# Patient Record
Sex: Female | Born: 1985 | Race: White | Hispanic: No | Marital: Single | Smoking: Current every day smoker
Health system: Southern US, Community
[De-identification: ages and names within clinical notes are randomized; demographics above are authoritative.]

## PROBLEM LIST (undated history)

## (undated) ENCOUNTER — Emergency Department (HOSPITAL_BASED_OUTPATIENT_CLINIC_OR_DEPARTMENT_OTHER): Payer: BC Managed Care – PPO

## (undated) ENCOUNTER — Inpatient Hospital Stay (HOSPITAL_COMMUNITY): Payer: PRIVATE HEALTH INSURANCE

## (undated) ENCOUNTER — Inpatient Hospital Stay (HOSPITAL_COMMUNITY): Payer: Self-pay

## (undated) DIAGNOSIS — IMO0002 Reserved for concepts with insufficient information to code with codable children: Secondary | ICD-10-CM

## (undated) DIAGNOSIS — D649 Anemia, unspecified: Secondary | ICD-10-CM

## (undated) DIAGNOSIS — Z8619 Personal history of other infectious and parasitic diseases: Secondary | ICD-10-CM

## (undated) DIAGNOSIS — E611 Iron deficiency: Secondary | ICD-10-CM

## (undated) DIAGNOSIS — N926 Irregular menstruation, unspecified: Secondary | ICD-10-CM

## (undated) DIAGNOSIS — A630 Anogenital (venereal) warts: Secondary | ICD-10-CM

## (undated) HISTORY — DX: Anemia, unspecified: D64.9

## (undated) HISTORY — DX: Reserved for concepts with insufficient information to code with codable children: IMO0002

## (undated) HISTORY — DX: Iron deficiency: E61.1

## (undated) HISTORY — DX: Irregular menstruation, unspecified: N92.6

## (undated) HISTORY — DX: Personal history of other infectious and parasitic diseases: Z86.19

---
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
## 2006-01-12 ENCOUNTER — Inpatient Hospital Stay (HOSPITAL_COMMUNITY): Admission: AD | Admit: 2006-01-12 | Discharge: 2006-01-12 | Payer: Self-pay | Admitting: Obstetrics and Gynecology

## 2006-01-26 ENCOUNTER — Inpatient Hospital Stay (HOSPITAL_COMMUNITY): Admission: AD | Admit: 2006-01-26 | Discharge: 2006-01-26 | Payer: Self-pay | Admitting: Obstetrics and Gynecology

## 2006-01-31 ENCOUNTER — Other Ambulatory Visit: Admission: RE | Admit: 2006-01-31 | Discharge: 2006-01-31 | Payer: Self-pay | Admitting: Obstetrics and Gynecology

## 2006-04-04 ENCOUNTER — Inpatient Hospital Stay (HOSPITAL_COMMUNITY): Admission: AD | Admit: 2006-04-04 | Discharge: 2006-04-04 | Payer: Self-pay | Admitting: Obstetrics and Gynecology

## 2006-04-13 ENCOUNTER — Inpatient Hospital Stay (HOSPITAL_COMMUNITY): Admission: AD | Admit: 2006-04-13 | Discharge: 2006-04-13 | Payer: Self-pay | Admitting: Family Medicine

## 2006-04-13 ENCOUNTER — Encounter: Payer: Self-pay | Admitting: Emergency Medicine

## 2006-06-15 ENCOUNTER — Inpatient Hospital Stay (HOSPITAL_COMMUNITY): Admission: AD | Admit: 2006-06-15 | Discharge: 2006-06-15 | Payer: Self-pay | Admitting: Obstetrics and Gynecology

## 2006-06-16 ENCOUNTER — Inpatient Hospital Stay (HOSPITAL_COMMUNITY): Admission: AD | Admit: 2006-06-16 | Discharge: 2006-06-16 | Payer: Self-pay | Admitting: Obstetrics and Gynecology

## 2006-08-21 ENCOUNTER — Inpatient Hospital Stay (HOSPITAL_COMMUNITY): Admission: AD | Admit: 2006-08-21 | Discharge: 2006-08-23 | Payer: Self-pay | Admitting: Obstetrics and Gynecology

## 2007-06-26 ENCOUNTER — Emergency Department (HOSPITAL_COMMUNITY): Admission: EM | Admit: 2007-06-26 | Discharge: 2007-06-26 | Payer: Self-pay | Admitting: Emergency Medicine

## 2007-08-24 ENCOUNTER — Encounter: Payer: Self-pay | Admitting: Internal Medicine

## 2007-10-02 ENCOUNTER — Emergency Department (HOSPITAL_COMMUNITY): Admission: EM | Admit: 2007-10-02 | Discharge: 2007-10-02 | Payer: Self-pay | Admitting: Emergency Medicine

## 2009-04-09 ENCOUNTER — Emergency Department (HOSPITAL_COMMUNITY): Admission: EM | Admit: 2009-04-09 | Discharge: 2009-04-09 | Payer: Self-pay | Admitting: Emergency Medicine

## 2009-10-21 ENCOUNTER — Emergency Department (HOSPITAL_COMMUNITY): Admission: EM | Admit: 2009-10-21 | Discharge: 2009-10-22 | Payer: Self-pay | Admitting: Emergency Medicine

## 2010-04-09 ENCOUNTER — Encounter: Admission: RE | Admit: 2010-04-09 | Discharge: 2010-04-09 | Payer: Self-pay | Admitting: Internal Medicine

## 2010-05-30 DIAGNOSIS — IMO0002 Reserved for concepts with insufficient information to code with codable children: Secondary | ICD-10-CM

## 2010-05-30 DIAGNOSIS — R87619 Unspecified abnormal cytological findings in specimens from cervix uteri: Secondary | ICD-10-CM

## 2010-05-30 HISTORY — DX: Unspecified abnormal cytological findings in specimens from cervix uteri: R87.619

## 2010-05-30 HISTORY — DX: Reserved for concepts with insufficient information to code with codable children: IMO0002

## 2010-09-01 LAB — URINALYSIS, ROUTINE W REFLEX MICROSCOPIC
Hgb urine dipstick: NEGATIVE
Protein, ur: NEGATIVE mg/dL
Urobilinogen, UA: 1 mg/dL (ref 0.0–1.0)

## 2010-09-01 LAB — COMPREHENSIVE METABOLIC PANEL
ALT: 13 U/L (ref 0–35)
Alkaline Phosphatase: 59 U/L (ref 39–117)
CO2: 21 mEq/L (ref 19–32)
Chloride: 104 mEq/L (ref 96–112)
Glucose, Bld: 109 mg/dL — ABNORMAL HIGH (ref 70–99)
Potassium: 3.7 mEq/L (ref 3.5–5.1)
Sodium: 136 mEq/L (ref 135–145)
Total Bilirubin: 0.7 mg/dL (ref 0.3–1.2)
Total Protein: 7.5 g/dL (ref 6.0–8.3)

## 2010-09-01 LAB — DIFFERENTIAL
Basophils Relative: 0 % (ref 0–1)
Eosinophils Absolute: 0.1 10*3/uL (ref 0.0–0.7)
Monocytes Relative: 4 % (ref 3–12)
Neutrophils Relative %: 74 % (ref 43–77)

## 2010-09-01 LAB — POCT PREGNANCY, URINE: Preg Test, Ur: NEGATIVE

## 2010-09-01 LAB — CBC
Hemoglobin: 16.4 g/dL — ABNORMAL HIGH (ref 12.0–15.0)
RBC: 5.12 MIL/uL — ABNORMAL HIGH (ref 3.87–5.11)
RDW: 12.2 % (ref 11.5–15.5)
WBC: 9.1 10*3/uL (ref 4.0–10.5)

## 2010-10-15 NOTE — Op Note (Signed)
NAMEJAYDYN, Teresa Farley             ACCOUNT NO.:  [id number]   MEDICAL RECORD NO.:  [id number]          PATIENT TYPE:  INP   LOCATION:  9115                          [facility]ILITY:  [facility]   PHYSICIAN:  Hal Morales, M.D.DATE OF BIRTH:  Oct 22, 1985   DATE OF PROCEDURE:  08/21/2006  DATE OF DISCHARGE:                               OPERATIVE REPORT   PREOPERATIVE DIAGNOSIS:  Intrauterine pregnancy at term, premature  rupture of membranes and meconium-stained amniotic fluid.   POSTOPERATIVE DIAGNOSES:  Intrauterine pregnancy at term, premature  rupture of membranes and meconium-stained amniotic fluid, tight nuchal  cord.   OPERATION:  Vacuum-assisted vaginal delivery over intact perineum.   SURGEON:  Dr. Maris Berger Haygood.   ANESTHESIA:  Epidural.   ESTIMATED BLOOD LOSS:  Less than 500 mL.   COMPLICATIONS:  None.   FINDINGS:  The patient was delivered of a female infant whose name is  Alyssa weighing 7 pounds 1 ounce with Apgars of 4 and 9 at one and five  minutes, respectively.  The cord pH was 7.29.  The placenta contained an  eccentrically-inserted three-vessel cord.   PROCEDURE:  The patient was in the lithotomy position for pushing and  had been pushing close to 2 hours.  She had brought the vertex onto the  perineum and had distended the perineum approximately 5 cm.  During the  pushing time, the patient had intermittently had deep variable  decelerations to around 88 beats per minute.  A discussion had been held  with the patient concerning the option of vacuum-assisted vaginal  delivery, its risks and benefits.  Once the patient had brought the  vertex to the aforementioned level, she said that she could not push any  longer, and the fetal heart rate decreased to 70-beat per minute range  and remained in that range.  The kiwi vacuum was then placed over the  vertex, and with a single pull, the fetal head was delivered over the  intact perineum.  The vacuum was removed and  a tight nuchal cord that  could not be reduced was noted.  It was divided between [place] clamps and  the remainder of the infant delivered with a combination of maternal  expulsive efforts and gentle traction..  The infant was placed in the  isolette and suctioned and blow-by instituted until the pediatric team  arrived.  The appropriate cord blood was drawn including a cord pH.  The  placenta was noted to have separated and was removed with a combination  of maternal expulsive efforts and gentle traction.  Adequate hemostasis  was achieved with fundal  massage.  There was a small first-degree vaginal laceration just inside  the introitus which did not require suture.  At this point, the perineum  was cleansed and an ice pack placed, and the patient left with her  infant in the [facility] facility for initial infant bonding.  It is  anticipated that she will go to the full-term nursery.      Hal Morales, M.D.  Electronically Signed     VPH/MEDQ  D:  08/21/2006  T:  08/22/2006  Job:  [id number]

## 2010-10-15 NOTE — Discharge Summary (Signed)
Teresa Farley, Teresa Farley             ACCOUNT NO.:  [id number]   MEDICAL RECORD NO.:  [id number]          PATIENT TYPE:  INP   LOCATION:  9115                          [facility]:  [facility]   PHYSICIAN:  Naima A. Dillard, M.D. DATE OF BIRTH:  [birth year]   DATE OF ADMISSION:  08/21/2006  DATE OF DISCHARGE:  08/23/2006                               DISCHARGE SUMMARY   ADMITTING DIAGNOSIS:  Intrauterine pregnancy at term.  Premature rupture  of membranes.   PROCEDURE:  Vacuum-assisted vaginal delivery.   DISCHARGE DIAGNOSES:  Intrauterine pregnancy at term delivered,  premature rupture of membranes, terminal meconium-stained amniotic  fluid, vacuum-assisted vaginal delivery, tight nuchal cord.  Teresa Farley is a 25 year old gravida 1, para 0 who presents at [redacted] weeks  gestation with premature rupture of membranes.  Labor was augmented with  Pitocin.  The patient progressed to 4 cm, at which time she was noted to  have several episodes of prolonged fetal heart rate decelerations to the  80s with return to the baseline, despite O2 by IV fluid bolus and  maternal position change.  She then began to have persistent late  decelerations.  At that point, Pitocin was turned off and terbutaline  was administered.  Dr. Dierdre Forth was called for evaluation.  It  was thought that the fetal heart rate decelerations were probably due to  hyperstimulation.  Pitocin was left off for approximately 1 hour, and  then started at a low dose.  Fetal heart rate remained reassuring and  that point until complete dilation, and during the pushing stage, the  patient had intermittent deep variable decelerations to the 80s.  The  patient continued pushing well; however, as the patient was crowning,  fetal heart rate decreased to 70 beats per minute and stayed in that  range.  Discussion was entered into by Dr. Dierdre Forth with the  patient and her family, and decision was made for vacuum extraction,  vaginal  assistance.  This was accomplished by Dr. Dierdre Forth.  Please see her dictated note.  The patient gave birth to a 7-pound 1-  ounce female infant named Alyssa with Apgar scores of 4 at 1 minute, 9  at 5 minutes, cord pH 7.29.  Both the patient and infant have been well  in the postpartum period.  Baby is breast feeding well, and the  patient's vital signs have remained stable.  She is afebrile.  Her  hemoglobin on the first postpartum day was 10.6.  Mild thrombocytopenia  was noted.  Her platelets on admission were 128, on the first postpartum  day 113, on the second postpartum day 75.  She is judged to be in  satisfactory condition for discharge.  However, she will come to the  office of [facility] in 1 week to repeat platelet count.   DISCHARGE INSTRUCTIONS:  Are per [facility] handout.   DISCHARGE MEDICATIONS:  1. Motrin 600 mg p.o. q.6 h p.r.n. pain  2. Tylox 1-2 p.o. q.3-4 h p.r.n. pain.  3. Prenatal vitamins.   The patient will call for any problems or concerns and follow up  in 1  week as stated above.      Teresa Farley, C.N.M.      Naima A. Normand Sloop, M.D.  Electronically Signed    SDM/MEDQ  D:  08/23/2006  T:  08/23/2006  Job:  [id number]

## 2010-10-15 NOTE — H&P (Signed)
Teresa Farley, Teresa Farley NO.:  [id number]   MEDICAL RECORD NO.:  [id number]          PATIENT TYPE:  MAT   LOCATION:  [facility]                          [facility]:  [facility]   PHYSICIAN:  Janine Limbo, M.D.DATE OF BIRTH:  [birth year]   DATE OF ADMISSION:  08/21/2006  DATE OF DISCHARGE:                              HISTORY & PHYSICAL   HISTORY OF PRESENT ILLNESS:  Ms. Flicker is a 25 year old, single,  white female, primigravida at 40-0/7 weeks who presents with leakage of  clear fluid since 3:40 a.m. and onset of contractions at that time.  She  denies bleeding, no signs or symptoms of PIH.  Her pregnancy has been  followed by the [facility] midwife service and has been  remarkable for:   1. First trimester bleeding.  2. History of condyloma.  3. Group B Streptococcus negative.   PRENATAL LABORATORY DATA:  Her prenatal labs were collected on 01/31/06.  Hemoglobin 13.8, hematocrit 40.4, platelet count 158,000, blood type B  positive, antibody negative.  RPR nonreactive.  Rubella immune.  Hepatitis B surface antigen negative.  HIV nonreactive.  Pap smear  within normal limits.  Gonorrhea negative. Chlamydia negative. Cystic  fibrosis negative.  Screening from 03/07/06 within normal limits.  One  hour Glucola from 05/19/06 was 95. RPR at that time was nonreactive.  Cultures of the vaginal tract for group B streptococcus, gonorrhea and  Chlamydia from 07/20/06 were all negative.   HISTORY OF PRESENT PREGNANCY:  The patient presented for care at [facility] on 01/31/06 at [redacted] weeks gestation.  Nuchal translucency from [redacted]  weeks gestation was within normal limits.  She declines a full first  trimester screen. Her quad screen was also within normal limits, done at  [redacted] weeks gestation.  Pregnancy ultrasonography at [redacted] weeks gestation for  anatomy showed growth consistent with previous dating, confirming ECD of  08/21/06.  Pregnancy ultrasonography was done at  31-1/[redacted] weeks gestation  for measuring size greater than dates, estimated fetal weight was 1895  grams with normal fluid, vertex presentation. She had some protein in  her urine at 36-1/[redacted] weeks gestation for which she had a negative PIH  workup. The rest of her prenatal care has been unremarkable.   OBSTETRICAL HISTORY:  She is primigravida.   PAST MEDICAL HISTORY:  No medication allergies.  She experienced  menarche at the age of 70 with 28 day cycles lasting 7 days.  She has  used oral contraceptives in the past.  She was diagnosed with condyloma  genital warts in 2006.  She reports having had the usual childhood  illnesses.  The patient is a previous smoker. She quit last year.   PAST SURGICAL HISTORY:  Negative.   FAMILY HISTORY:  Remarkable for the patient's mother smoking.   GENETIC HISTORY:  Negative.   SOCIAL HISTORY:  Father of the baby is not involved.  His name is  Electronics engineer.  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 Father of the baby has a 12th grade education and is  employed full  time in carpet.  They deny any alcohol, tobacco or illicit  drug use with the pregnancy.   OBJECTIVE DATA:  VITAL SIGNS: Blood pressure is 106/55.  Vital signs are  stable.  She is afebrile.  HEENT:  Grossly within normal limits.  HEART: Regular rate and rhythm.  ABDOMEN:  Gravid in contour with sternal head extending approximately 39  cm above pubic symphysis.  Fetal heart rate is in the 120s to 130s,  reassuring, negative CST.  Contractions are every 2-3 minutes.  PELVIC:  Remarkable for clear fluid on perineum, positive Nitrazine,  positive ferning, cervix is 1 cm, 60% effaced, vertex -2.  EXTREMITIES:  Normal.   ASSESSMENT:  1. Intrauterine pregnancy at term.  2. Spontaneous rupture of membranes.  3. Early labor.   PLAN:  1. Admit to birthing suite.  2. Routine C&M orders.  3. Patient plans pain medication as labor progresses.  4. Plan of  expectant management.      Cam Hai, C.N.M.      Janine Limbo, M.D.  Electronically Signed    KS/MEDQ  D:  08/21/2006  T:  08/21/2006  Job:  [id number]

## 2010-10-29 HISTORY — PX: DILATION AND CURETTAGE OF UTERUS: SHX78

## 2010-11-10 ENCOUNTER — Inpatient Hospital Stay (HOSPITAL_COMMUNITY): Payer: Medicaid Other

## 2010-11-10 ENCOUNTER — Ambulatory Visit (HOSPITAL_COMMUNITY)
Admission: AD | Admit: 2010-11-10 | Discharge: 2010-11-10 | Disposition: A | Discharge status: Home / Self Care | Payer: Medicaid Other | Source: Ambulatory Visit | Attending: Family Medicine | Admitting: Family Medicine

## 2010-11-10 ENCOUNTER — Other Ambulatory Visit: Payer: Self-pay | Admitting: Family Medicine

## 2010-11-10 DIAGNOSIS — R58 Hemorrhage, not elsewhere classified: Secondary | ICD-10-CM

## 2010-11-10 DIAGNOSIS — IMO0002 Reserved for concepts with insufficient information to code with codable children: Secondary | ICD-10-CM | POA: Insufficient documentation

## 2010-11-10 DIAGNOSIS — R52 Pain, unspecified: Secondary | ICD-10-CM

## 2010-11-10 DIAGNOSIS — R109 Unspecified abdominal pain: Secondary | ICD-10-CM | POA: Insufficient documentation

## 2010-11-10 DIAGNOSIS — O074 Failed attempted termination of pregnancy without complication: Secondary | ICD-10-CM

## 2010-11-10 LAB — ABO/RH: ABO/RH(D): B POS

## 2010-11-10 LAB — URINALYSIS, ROUTINE W REFLEX MICROSCOPIC
Glucose, UA: NEGATIVE mg/dL
Leukocytes, UA: NEGATIVE
Specific Gravity, Urine: <1.005 — ABNORMAL LOW (ref 1.005–1.030)
pH: 6 (ref 5.0–8.0)

## 2010-11-10 LAB — URINE MICROSCOPIC-ADD ON

## 2010-11-10 LAB — CBC
HCT: 31.2 % — ABNORMAL LOW (ref 36.0–46.0)
MCHC: 34.3 g/dL (ref 30.0–36.0)
MCV: 91 fL (ref 78.0–100.0)
RDW: 12.2 % (ref 11.5–15.5)

## 2010-11-11 NOTE — Op Note (Signed)
  Teresa Farley, Teresa Farley             ACCOUNT NO.:  [id number]  MEDICAL RECORD NO.:  [id number]  LOCATION:  [facility]                          [facility]  [facility]  PHYSICIAN:  Rogan Wigley S. Shawnie Pons, M.D.   DATE OF BIRTH:  1985/11/14  DATE OF PROCEDURE:  11/10/2010 DATE OF DISCHARGE:                              OPERATIVE REPORT   PREOPERATIVE DIAGNOSIS:  Retained products of conception.  POSTOPERATIVE DIAGNOSIS:  Retained products of conception.  PROCEDURE:  Suction dilation and curettage.  SURGEON:  Shelbie Proctor. Shawnie Pons, MD  ASSISTANT:  None.  ANESTHESIA:  General and local.  FINDINGS:  Approximately 10-week size uterus.  Old blood and clot noted in the uterus.  SPECIMENS:  Uterine contents to pathology.  BLOOD LOSS:  Minimal.  COMPLICATIONS:  None known.  REASON FOR PROCEDURE:  Briefly, the patient is a 25 year old who underwent a termination of pregnancy last week came in today with heavy bleeding.  Ultrasound showed what looked to be intrauterine gestational sac with fetal pole with no heart beat versus clot.  They were unclear exactly what it was but given her bleeding and amount of pain, it was felt she would best served by operative procedure.  PROCEDURE:  The patient taken to the OR where she was placed in dorsal lithotomy in Sunrise stirrups.  She was prepped and draped in the usual sterile fashion.  Red rubber catheter was used to drain her bladder.  A speculum was placed inside the vagina.  Cervix was visualized and grasped anteriorly with single-tooth tenaculum.  A 20 mL of 1% lidocaine with epinephrine were injected for paracervical block.  Attempt was to sound the uterus, however, the sound could not be passed.  Ultrasound was then used until the uterus sounded to approximately 10 cm. Sequential dilation was done at curved 10-French curette.  Suction curette was then used and passed until there was minimal amount of tissue and clot removed from the uterus.  Sharp curettage was  then done until there was gritty texture in all surfaces.  The suction curette was passed one more time.  Minimal tissue was removed.  All instruments were then removed from the vagina. Bimanual exam revealed the firm uterus with minimal blood loss.  All instrument and lap counts were correct x2.  The patient was awakened and taken to recovery room in stable condition.     Shelbie Proctor. Shawnie Pons, M.D.     TSP/MEDQ  D:  11/10/2010  T:  11/11/2010  Job:  [id number]  Electronically Signed by Tinnie Gens M.D. on 11/11/2010 03:28:37 PM

## 2010-12-06 ENCOUNTER — Ambulatory Visit (INDEPENDENT_AMBULATORY_CARE_PROVIDER_SITE_OTHER): Payer: Self-pay | Admitting: Obstetrics & Gynecology

## 2010-12-06 ENCOUNTER — Inpatient Hospital Stay: Admission: EM | Admit: 2010-12-06 | Payer: Self-pay | Source: Ambulatory Visit | Admitting: Obstetrics & Gynecology

## 2010-12-06 ENCOUNTER — Encounter: Payer: Self-pay | Admitting: Obstetrics & Gynecology

## 2010-12-06 VITALS — BP 115/66 | HR 76 | Temp 97.8°F | Ht 67.0 in | Wt 164.0 lb

## 2010-12-06 DIAGNOSIS — Z09 Encounter for follow-up examination after completed treatment for conditions other than malignant neoplasm: Secondary | ICD-10-CM

## 2010-12-06 NOTE — Progress Notes (Signed)
  Subjective:     Teresa Farley is a 25 y.o. female who presents to the clinic 4 weeks status post dilation and curettage for first trimester miscarriage. Eating a regular diet without difficulty. Bowel movements are normal. The patient is not having any pain.  The following portions of the patient's history were reviewed and updated as appropriate: allergies, current medications, past medical history, past social history, past surgical history and problem list.  Review of Systems Genitourinary:negative for dysuria    Objective:    BP 115/66  Pulse 76  Temp(Src) 97.8 F (36.6 C) (Oral)  Ht 5\' 7"  (1.702 m)  Wt 164 lb (74.39 kg)  BMI 25.69 kg/m2  LMP 08/30/2010 General:  alert  Abdomen: deferred  Incision:   no incision     Assessment:    Doing well postoperatively. Operative findings again reviewed. Pathology report discussed.    Plan:    1. Continue any current medications. 2. Wound care discussed. 3. Activity restrictions: Abstain from intercourse until IUD 4. Anticipated return to work: now. 5. Follow up: 1 week for Mirena placement.

## 2010-12-06 NOTE — Patient Instructions (Signed)
IMPORTANT: HOW TO USE THIS INFORMATION:  This is a summary and does NOT have all possible information about this product. This information does not assure that this product is safe, effective, or appropriate for you. This information is not individual medical advice and does not substitute for the advice of your health care professional. Always ask your health care professional for complete information about this product and your specific health needs.    LEVONORGESTREL-RELEASING IMPLANT - INTRAUTERINE (lee-voh-nor-JEST-rell)    COMMON BRAND NAME(S): Mirena    USES:  This product is a small, flexible device that is placed in the womb (uterus) to prevent pregnancy. It is used in women who desire reversible birth control that works for a long time (up to 5 years). The device works by slowly releasing a hormone (levonorgestrel) that is similar to a certain substance made by a woman's body. This product is only intended for women who have previously given birth and have only one sexual partner. It is not meant for women with a history of certain infections/conditions (e.g., pelvic inflammatory disease, sexually transmitted disease, a certain problem pregnancy called ectopic pregnancy). For more information, consult your doctor. The use of this medication device does not protect you or your partner against sexually transmitted diseases (e.g., HIV, gonorrhea). Carefully read all of the information provided by your doctor, and ask any questions you may have about this product or other birth control methods that may be right for you.    HOW TO USE:  Read the Patient Information Leaflet provided by your pharmacist before this medication device is inserted and each time it is re-inserted. The leaflet contains very important information about side effects and when it is important to call your doctor. If you have any questions, consult your doctor or pharmacist. This product is inserted into your uterus by a properly  trained health care professional, usually once every 5 years or as determined by your doctor. The medication in the device is slowly released into the body over a 5-year period. Have a follow-up appointment 4-12 weeks after insertion of this product to check that it is still correctly in place. If you still desire birth control after 5 years, the medication device may be replaced with a new one. The medication device may also be removed at any time by a properly trained health care professional. Learn all the instructions on how and when to check this product and its proper positioning in your body, and make sure you understand the problems that may occur with this product. See also Precautions section.    SIDE EFFECTS:  Irregular vaginal bleeding (e.g., spotting), cramps, headache, nausea, breast pain, acne, rash, hair loss, weight gain, or decreased interest in sex may occur. If any of these effects persist or worsen, tell your doctor promptly. Remember that your doctor has prescribed this medication device because he or she has judged that the benefit to you is greater than the risk of side effects. Many people using this medication device do not have serious side effects. Tell your doctor immediately if any of these serious side effects occur: lack of menstrual period, unexplained fever, chills, trouble breathing, mental/mood changes (e.g., depression, nervousness), vaginal swelling/itching, painful intercourse. Tell your doctor immediately if any of these unlikely but serious side effects occur: migraine/severe headache, vomiting, tiredness, fast/pounding heartbeat. Tell your doctor immediately if any of these highly unlikely but very serious side effects occur: prolonged or heavy vaginal bleeding, unusual vaginal discharge/odor, vaginal sores, abdominal/pelvic pain or   tenderness, lumps in the breast, yellowing eyes/skin, dark urine, persistent nausea, trouble urinating. A very serious allergic reaction to  this drug is rare. However, seek immediate medical attention if you notice any of the following symptoms of a serious allergic reaction: rash, itching/swelling (especially of the face/tongue/throat), severe dizziness, trouble breathing. This is not a complete list of possible side effects. If you notice other effects not listed above, contact your doctor or pharmacist. In the [place] - Call your doctor for medical advice about side effects. You may report side effects to FDA at 1-800-FDA-1088. In [place] - Call your doctor for medical advice about side effects. You may report side effects to [employer] [place] at 1-866-234-2345.    PRECAUTIONS:  Before using this medication device, tell your doctor or pharmacist if you are allergic to levonorgestrel, or to any other progestins (e.g., norethindrone, desogestrel); or if you have any other allergies. This product may contain inactive ingredients, which can cause allergic reactions or other problems. Talk to your pharmacist for more details. This medication device should not be used if you have certain medical conditions. Before using this product, consult your doctor or pharmacist if you have: current known or suspected pregnancy, previous ectopic pregnancy, uterus problems (e.g., cancer, endometriosis, fibroids, pelvic inflammatory disease-PID), other IUD (intrauterine device) still in place, vaginal problems (e.g., infection), breast cancer, liver disease/tumors, any condition that affects your immune system (e.g., AIDS, leukemia). Before using this product, tell your doctor your medical history, especially of: bleeding problems (e.g., menstrual changes, clotting problems), heart problems (e.g., congenital valve conditions), high blood pressure, migraine headaches, stroke, diabetes. If you have diabetes, this medication may make it harder to control your blood sugar levels. Monitor your blood sugar regularly as directed by your doctor. Tell your doctor the results and any  symptoms such as increased thirst/urination. Your anti-diabetic medication or diet may need to be adjusted. This medication device may sometimes come out by itself or move out of place. This may result in unwanted pregnancy or other problems. After each menstrual period, check to make sure it is in the right place. Talk to your doctor about how to check your device. If it comes out or you cannot feel its threads, call your doctor promptly, and use a backup birth control method such as condoms. If you or partner has any other sexual partners, this medication device may no longer be a good choice for pregnancy prevention. If you or your partner becomes HIV positive, or if you think you may have been exposed to any sexually transmitted disease, contact your doctor immediately. You should consider having this device removed. This medication device must not be used during pregnancy. If you become pregnant or think you may be pregnant, tell your doctor immediately. If you have just given birth and are not breast-feeding, or if you have had a pregnancy loss or abortion after the 3 months of pregnancy, wait at least 6 weeks (or as directed by your doctor) before using this medication device. Consult your doctor about the problems that may occur during pregnancy while using this product. Levonorgestrel passes into breast milk. Consult your doctor before breast-feeding.    DRUG INTERACTIONS:  Your doctor or pharmacist may already be aware of any possible drug interactions and may be monitoring you for them. Do not start, stop, or change the dosage of any medicine before checking with them first. Before using this medication device, tell your doctor of all prescription and nonprescription medications you may use, especially   of: "blood thinners" (e.g., warfarin), birth control taken by mouth or applied to the skin (patch), certain drug used for varicose vein treatment (sodium tetradecyl sulfate), drugs that affect your immune  response (e.g., corticosteroids such as prednisone). This document does not contain all possible interactions. Therefore, before using this product, tell your doctor or pharmacist of all the products you use. Keep a list of all your medications with you, and share the list with your doctor and pharmacist.    OVERDOSE:  Overdose with this medication is very unlikely because of the way the drug is released from this device. Consult your doctor or pharmacist for more information.    NOTES:  Do not share this medication with others. Keep all appointments with your doctor and the laboratory. You should have regular complete physical exams including blood pressure, breast exam, pelvic exam, and screening for cervical cancer (Pap smear). Follow your doctor's instructions for examining your own breasts, and report any lumps immediately. Consult your doctor for more details.    MISSED DOSE:  Not applicable.    STORAGE:  Before use, store at room temperature at 77 degrees F (25 degrees C) away from light and moisture. Brief storage between 59-86 degrees F (15-30 degrees C) is permitted. Keep all medications and medical devices away from children and pets. Do not flush medications down the toilet or pour them into a drain unless instructed to do so. Properly discard this product when it is expired or no longer needed. Consult your pharmacist or local waste disposal company for more details about how to safely discard your product.    Information last revised June 2010 Copyright(c) 2010 [employer]      

## 2011-01-13 ENCOUNTER — Ambulatory Visit: Payer: Self-pay | Admitting: Obstetrics & Gynecology

## 2011-02-17 LAB — URINALYSIS, ROUTINE W REFLEX MICROSCOPIC
Glucose, UA: NEGATIVE
Hgb urine dipstick: NEGATIVE
Ketones, ur: 15 — AB
Protein, ur: NEGATIVE

## 2011-02-23 ENCOUNTER — Encounter: Payer: Self-pay | Admitting: Obstetrics & Gynecology

## 2011-02-23 ENCOUNTER — Telehealth: Payer: Self-pay | Admitting: Obstetrics and Gynecology

## 2011-02-23 ENCOUNTER — Ambulatory Visit (INDEPENDENT_AMBULATORY_CARE_PROVIDER_SITE_OTHER): Payer: Medicaid Other | Admitting: Obstetrics & Gynecology

## 2011-02-23 VITALS — BP 121/75 | HR 84 | Temp 98.2°F | Resp 20 | Ht 67.0 in | Wt 165.7 lb

## 2011-02-23 DIAGNOSIS — A630 Anogenital (venereal) warts: Secondary | ICD-10-CM

## 2011-02-23 DIAGNOSIS — Z30430 Encounter for insertion of intrauterine contraceptive device: Secondary | ICD-10-CM

## 2011-02-23 DIAGNOSIS — N898 Other specified noninflammatory disorders of vagina: Secondary | ICD-10-CM

## 2011-02-23 DIAGNOSIS — Z01812 Encounter for preprocedural laboratory examination: Secondary | ICD-10-CM

## 2011-02-23 DIAGNOSIS — Z975 Presence of (intrauterine) contraceptive device: Secondary | ICD-10-CM

## 2011-02-23 HISTORY — DX: Anogenital (venereal) warts: A63.0

## 2011-02-23 NOTE — Telephone Encounter (Signed)
GC/Chlamydia was ordered to be tested using the urine she left for UA pregnancy (used for endometrial biopsy). The urine was poured out. Called patient to tell her to get the test done at [facility] department since she goes there for her Condyloma treatment (per patient). If not, then she can come back here at the clinic for these tests (GC/chlamydia). Left message in patient's VM to call us back in regard of this.

## 2011-02-23 NOTE — Progress Notes (Signed)
  Subjective:    Patient ID: Teresa Farley, female    DOB: [birth year], 25 y.o.   MRN: [id number]  HPI    Review of Systems     Objective:   Physical Exam        Assessment & Plan:  IUD Insertion Procedure Note  Pre-operative Diagnosis: desires LARC  Post-operative Diagnosis: same  Indications: contraception  Procedure Details  Urine pregnancy test was done  and result was negative.  The risks (including infection, bleeding, pain, and uterine perforation) and benefits of the procedure were explained to the patient and Verbal informed consent was obtained.    Cervix cleansed with Betadine. Uterus sounded to 8 cm. IUD inserted without difficulty. String visible and trimmed. Patient tolerated procedure well.  IUD Information: Mirena.  Condition: Stable  Complications: None  Plan: Annual exam in January  The patient was advised to call for any fever or for prolonged or severe pain or bleeding. She was advised to use NSAID as needed for mild to moderate pain.   Attending Physician Documentation: I was present for or participated in the entire procedure, including opening and closing.

## 2011-02-23 NOTE — Progress Notes (Signed)
Addended by: Toula Moos on: 02/23/2011 03:27 PM   Modules accepted: Orders

## 2011-02-25 NOTE — Telephone Encounter (Signed)
Called pt- left message that we need to speak to her regarding a test which has been ordered. Please call back

## 2011-02-28 NOTE — Telephone Encounter (Signed)
Pt left message on callback line that she is returning our call. [phone]

## 2011-02-28 NOTE — Telephone Encounter (Signed)
Spoke with patient. She will come by on Weds 03/02/11 to give urine for gc/chlamydia.

## 2011-03-02 ENCOUNTER — Other Ambulatory Visit: Payer: Medicaid Other

## 2011-03-03 ENCOUNTER — Other Ambulatory Visit: Payer: Medicaid Other

## 2011-08-27 ENCOUNTER — Emergency Department (HOSPITAL_COMMUNITY)
Admission: EM | Admit: 2011-08-27 | Discharge: 2011-08-27 | Disposition: A | Discharge status: Home / Self Care | Payer: BC Managed Care – PPO | Source: Home / Self Care | Attending: Family Medicine | Admitting: Family Medicine

## 2011-08-27 ENCOUNTER — Encounter (HOSPITAL_COMMUNITY): Payer: Self-pay

## 2011-08-27 DIAGNOSIS — A084 Viral intestinal infection, unspecified: Secondary | ICD-10-CM

## 2011-08-27 DIAGNOSIS — A09 Infectious gastroenteritis and colitis, unspecified: Secondary | ICD-10-CM

## 2011-08-27 MED ORDER — DEXTROSE-NACL 5-0.45 % IV SOLN
Freq: Once | INTRAVENOUS | Status: AC
  Administered 2011-08-27: 15:00:00 via INTRAVENOUS

## 2011-08-27 MED ORDER — ONDANSETRON 4 MG PO TBDP
ORAL_TABLET | ORAL | Status: AC
  Filled 2011-08-27: qty 1

## 2011-08-27 MED ORDER — ONDANSETRON 4 MG PO TBDP
4.0000 mg | ORAL_TABLET | Freq: Once | ORAL | Status: AC
  Administered 2011-08-27: 4 mg via ORAL

## 2011-08-27 MED ORDER — ONDANSETRON HCL 4 MG PO TABS: 4.0000 mg | ORAL_TABLET | Freq: Four times a day (QID) | ORAL | Status: AC

## 2011-08-27 NOTE — ED Notes (Signed)
Pt has nausea, vomiting and diarrhea since 6 am today, daughter recently had norovirus.

## 2011-08-27 NOTE — Discharge Instructions (Signed)
Clear liquid , bland diet tonight as tolerated, advance on sunday as improved, use medicine as needed for vomiting, use imodium for diarrhea as needed., return or see your doctor if any problems.

## 2011-08-27 NOTE — ED Provider Notes (Signed)
History     CSN: [id number]  Arrival date & time 08/27/11  1252   First MD Initiated Contact with Patient 08/27/11 1255      Chief Complaint  Patient presents with  . Emesis  . Diarrhea    (Consider location/radiation/quality/duration/timing/severity/associated sxs/prior treatment) Patient is a 26 y.o. female presenting with vomiting and diarrhea. The history is provided by the patient and a parent.  Emesis  This is a new problem. The current episode started 6 to 12 hours ago. The problem occurs 2 to 4 times per day. The problem has not changed since onset.The emesis has an appearance of stomach contents. There has been no fever. Associated symptoms include diarrhea. Pertinent negatives include no fever. Risk factors include ill contacts (daughter recently with same.).  Diarrhea The primary symptoms include nausea, vomiting and diarrhea. Primary symptoms do not include fever or rash.    History reviewed. No pertinent past medical history.  Past Surgical History  Procedure Date  . Dilation and curettage of uterus june 2012    Family History  Problem Relation Age of Onset  . Cancer Maternal Grandmother     breast cancer    History  Substance Use Topics  . Smoking status: Current Everyday Smoker -- 0.5 packs/day for 5 years    Types: Cigarettes  . Smokeless tobacco: Never Used  . Alcohol Use: 0.0 oz/week    0 drink(s) per week    OB History    Grav Para Term Preterm Abortions TAB SAB Ect Mult Living   3 1 1  0 2 1 1  0 0 1      Review of Systems  Constitutional: Positive for activity change and appetite change. Negative for fever.  HENT: Negative.   Respiratory: Negative.   Cardiovascular: Negative.   Gastrointestinal: Positive for nausea, vomiting and diarrhea. Negative for blood in stool.  Genitourinary: Negative.   Skin: Negative for rash.    Allergies  Review of patient's allergies indicates no known allergies.  Home Medications   Current Outpatient Rx   Name Route Sig Dispense Refill  . ONDANSETRON HCL 4 MG PO TABS Oral Take 1 tablet (4 mg total) by mouth every 6 (six) hours. 8 tablet 0    BP 129/69  Pulse 90  Temp(Src) 98.3 F (36.8 C) (Oral)  Resp 16  SpO2 100%  LMP 07/29/2011  Physical Exam  Nursing note and vitals reviewed. Constitutional: She is oriented to person, place, and time. She appears well-developed and well-nourished. She appears distressed.  HENT:  Head: Normocephalic.  Mouth/Throat: Oropharynx is clear and moist.  Eyes: Pupils are equal, round, and reactive to light.  Neck: Normal range of motion. Neck supple.  Cardiovascular: Normal rate and regular rhythm.   Abdominal: Soft. Bowel sounds are normal. She exhibits no distension and no mass. There is no tenderness. There is no rebound and no guarding.  Lymphadenopathy:    She has no cervical adenopathy.  Neurological: She is alert and oriented to person, place, and time.  Skin: Skin is warm and dry.    ED Course  Procedures (including critical care time)  Labs Reviewed - No data to display No results found.   1. Gastroenteritis and colitis, viral       MDM  Sx improved after ivf and med.      Linna Hoff, MD 08/27/11 1538

## 2011-11-15 ENCOUNTER — Emergency Department (HOSPITAL_COMMUNITY)
Admission: EM | Admit: 2011-11-15 | Discharge: 2011-11-15 | Disposition: A | Discharge status: Home / Self Care | Payer: BC Managed Care – PPO | Source: Home / Self Care | Attending: Emergency Medicine | Admitting: Emergency Medicine

## 2011-11-15 ENCOUNTER — Encounter (HOSPITAL_COMMUNITY): Payer: Self-pay | Admitting: *Deleted

## 2011-11-15 DIAGNOSIS — N739 Female pelvic inflammatory disease, unspecified: Secondary | ICD-10-CM

## 2011-11-15 LAB — POCT URINALYSIS DIP (DEVICE)
Bilirubin Urine: NEGATIVE
Glucose, UA: NEGATIVE mg/dL
Hgb urine dipstick: NEGATIVE
Specific Gravity, Urine: 1.015 (ref 1.005–1.030)

## 2011-11-15 LAB — DIFFERENTIAL
Eosinophils Relative: 1 % (ref 0–5)
Lymphocytes Relative: 32 % (ref 12–46)
Lymphs Abs: 2.7 10*3/uL (ref 0.7–4.0)
Monocytes Absolute: 0.5 10*3/uL (ref 0.1–1.0)

## 2011-11-15 LAB — WET PREP, GENITAL: Trich, Wet Prep: NONE SEEN

## 2011-11-15 LAB — CBC
HCT: 42.4 % (ref 36.0–46.0)
MCV: 91.4 fL (ref 78.0–100.0)
RBC: 4.64 MIL/uL (ref 3.87–5.11)
WBC: 8.3 10*3/uL (ref 4.0–10.5)

## 2011-11-15 MED ORDER — CEFTRIAXONE SODIUM 250 MG IJ SOLR
250.0000 mg | Freq: Once | INTRAMUSCULAR | Status: AC
  Administered 2011-11-15: 250 mg via INTRAMUSCULAR

## 2011-11-15 MED ORDER — TRAMADOL HCL 50 MG PO TABS: 100.0000 mg | ORAL_TABLET | Freq: Three times a day (TID) | ORAL | Status: AC | PRN

## 2011-11-15 MED ORDER — LIDOCAINE HCL (PF) 1 % IJ SOLN
INTRAMUSCULAR | Status: AC
  Filled 2011-11-15: qty 5

## 2011-11-15 MED ORDER — AZITHROMYCIN 250 MG PO TABS
1000.0000 mg | ORAL_TABLET | Freq: Once | ORAL | Status: AC
  Administered 2011-11-15: 1000 mg via ORAL

## 2011-11-15 MED ORDER — CEFTRIAXONE SODIUM 250 MG IJ SOLR
INTRAMUSCULAR | Status: AC
  Filled 2011-11-15: qty 250

## 2011-11-15 MED ORDER — METRONIDAZOLE 500 MG PO TABS: 500.0000 mg | ORAL_TABLET | Freq: Three times a day (TID) | ORAL | Status: AC

## 2011-11-15 MED ORDER — AZITHROMYCIN 250 MG PO TABS
ORAL_TABLET | ORAL | Status: AC
  Filled 2011-11-15: qty 4

## 2011-11-15 NOTE — ED Provider Notes (Signed)
Chief Complaint  Patient presents with  . Abdominal Pain    History of Present Illness:   The patient is a 26 y.o. female who presents today with a history since 1 AM this morning of a sudden onset of bilateral lower abdominal pain. She was awakened out of a sleep with this. The pain is a constant pain punctuated by sudden sharp stabbing. It's worse if she walks and better she sits. She denies any fevers, chills, or sweats. She's felt somewhat nauseated and hasn't had much of an appetite. She denies any vomiting, constipation, diarrhea, or blood in the stool. Her last menstrual period was around June 1. She is sexually active. She has a Mirena IUD. She denies any discharge or itching. Denies urinary symptoms. No prior history of ovarian cysts, pelvic infections, or other GYN complaints.  Review of Systems:  Other than noted above, the patient denies any of the following symptoms: Constitutional:  No fever, chills, fatigue, weight loss or anorexia. Lungs:  No cough or shortness of breath. Heart:  No chest pain, palpitations, syncope or edema.  No cardiac history. Abdomen:  No nausea, vomiting, hematememesis, melena, diarrhea, or hematochezia. GU:  No dysuria, frequency, urgency, or hematuria. Gyn:  No vaginal discharge, itching, abnormal bleeding, dyspareunia, or pelvic pain. Skin:  No rash or itching.  PMFSH:  Past medical history, family history, social history, meds, and allergies were reviewed along with nurse's notes.  No prior abdominal surgeries, past history of GI problems, STDs or GYN problems.  No history of aspirin or NSAID use.  No excessive alcohol intake.  Physical Exam:   Vital signs:  BP 110/69  Pulse 72  Temp 98.5 F (36.9 C) (Oral)  Resp 14  SpO2 100% Gen:  Alert, oriented, in no distress. Lungs:  Breath sounds clear and equal bilaterally.  No wheezes, rales or rhonchi. Heart:  Regular rhythm.  No gallops or murmers.   Abdomen:  Abdomen is soft, flat, nondistended. Bowel  sounds were present. She has moderate bilateral lower abdominal tenderness to palpation without guarding or rebound. No organomegaly or mass. Pelvic:  Normal external genitalia, vaginal and cervical mucosa are normal, no discharge, IUD string is protruding from the cervical os she has moderate cervical motion tenderness, uterus is mid position, normal in size and shape, and moderately tender. She has moderate bilateral adnexal tenderness to palpation in without any mass. Skin:  Clear, warm and dry.  No rash.  Labs:   Results for orders placed during the hospital encounter of 11/15/11  POCT URINALYSIS DIP (DEVICE)      Component Value Range   Glucose, UA NEGATIVE  NEGATIVE mg/dL   Bilirubin Urine NEGATIVE  NEGATIVE   Ketones, ur NEGATIVE  NEGATIVE mg/dL   Specific Gravity, Urine 1.015  1.005 - 1.030   Hgb urine dipstick NEGATIVE  NEGATIVE   pH 6.5  5.0 - 8.0   Protein, ur NEGATIVE  NEGATIVE mg/dL   Urobilinogen, UA 0.2  0.0 - 1.0 mg/dL   Nitrite NEGATIVE  NEGATIVE   Leukocytes, UA NEGATIVE  NEGATIVE  POCT PREGNANCY, URINE      Component Value Range   Preg Test, Ur NEGATIVE  NEGATIVE  CBC      Component Value Range   WBC 8.3  4.0 - 10.5 K/uL   RBC 4.64  3.87 - 5.11 MIL/uL   Hemoglobin 14.6  12.0 - 15.0 g/dL   HCT 16.1  09.6 - 04.5 %   MCV 91.4  78.0 - 100.0  fL   MCH 31.5  26.0 - 34.0 pg   MCHC 34.4  30.0 - 36.0 g/dL   RDW 16.1  09.6 - 04.5 %   Platelets 165  150 - 400 K/uL  DIFFERENTIAL      Component Value Range   Neutrophils Relative 61  43 - 77 %   Neutro Abs 5.0  1.7 - 7.7 K/uL   Lymphocytes Relative 32  12 - 46 %   Lymphs Abs 2.7  0.7 - 4.0 K/uL   Monocytes Relative 6  3 - 12 %   Monocytes Absolute 0.5  0.1 - 1.0 K/uL   Eosinophils Relative 1  0 - 5 %   Eosinophils Absolute 0.1  0.0 - 0.7 K/uL   Basophils Relative 0  0 - 1 %   Basophils Absolute 0.0  0.0 - 0.1 K/uL  WET PREP, GENITAL      Component Value Range   Yeast Wet Prep HPF POC NONE SEEN  NONE SEEN   Trich,  Wet Prep NONE SEEN  NONE SEEN   Clue Cells Wet Prep HPF POC MODERATE (*) NONE SEEN   WBC, Wet Prep HPF POC FEW (*) NONE SEEN    Other Labs Obtained at Urgent Care Center:  GC and Chlamydia DNA probes were obtained.  Results are pending at this time and we will call about any positive results.  Course in Urgent Care Center:   She was given Rocephin 250 mg IM and azithromycin 1000 mg by mouth and tolerated these well without any immediate side effects.  Assessment:  The encounter diagnosis was Pelvic inflammatory disease. I think this could be a PID or a ruptured ovarian cyst. She is at a little bit high risk a PID since she has an IUD in place. I think she needs to be evaluated by a gynecologist. For right now on but only in her IUD in place, but I did tell her that if she got worse, to come back here or go right to the emergency room.  Plan:   1.  The following meds were prescribed:   New Prescriptions   METRONIDAZOLE (FLAGYL) 500 MG TABLET    Take 1 tablet (500 mg total) by mouth 3 (three) times daily.   TRAMADOL (ULTRAM) 50 MG TABLET    Take 2 tablets (100 mg total) by mouth every 8 (eight) hours as needed for pain.   2.  The patient was instructed in symptomatic care and handouts were given. 3.  The patient was told to return if becoming worse in any way, if no better in 3 or 4 days, and given some red flag symptoms that would indicate earlier return.  Follow up:  The patient was told to follow up here in 48 hours for recheck, she was given a note for work in the meantime, and given a referral to see Dr. Rana Snare in one week.     Reuben Likes, MD 11/15/11 [phone]

## 2011-11-15 NOTE — ED Notes (Signed)
Pt waiting on med time.  

## 2011-11-15 NOTE — Discharge Instructions (Signed)
Rest for next 2 days, follow up with gynecologist in 1 week.  Return if you are worse or if not getting better in 2 to 3 days.

## 2011-11-15 NOTE — ED Notes (Signed)
Wet prep and gc swab collected after escorting dr. Lorenz Coaster for pelvic. Labs ordered to be drawn.

## 2011-11-15 NOTE — ED Notes (Signed)
Pt reports being awakened from sleep around 1 am with sharp cramps in lower abdomen and back with little relief from motrin. Denies urinary frequency, pain with urination

## 2011-11-16 LAB — GC/CHLAMYDIA PROBE AMP, GENITAL: GC Probe Amp, Genital: NEGATIVE

## 2011-11-16 NOTE — ED Notes (Signed)
GC/Chlamydia neg., Wet prep: Mod. clue cells, few WBC's.  Pt. adequately treated with Flagyl. Vassie Moselle 11/16/2011

## 2011-11-17 ENCOUNTER — Telehealth (HOSPITAL_COMMUNITY): Payer: Self-pay | Admitting: *Deleted

## 2011-11-17 NOTE — ED Notes (Signed)
Pt. called and said Dr. Lorenz Coaster wanted her to be rechecked in 1 week by Dr. Rana Snare, but she could not be seen until next month. She has an Chief Financial Officer. I told her she could see her own doctor. We only give her the on call doctor if she does not have one to f/u with.  She said she tried [address] and was scheduled for Wed. 7/10 as the earliest appointment.  I asked if she had insurance. She said she is under her mothers insurance. I told her I would try and call for an earlier appointment. I called [phone] and told the scheduler that our doctor wanted her rechecked in 1 week.  She said she would have her supervisor call me back. She said they are on EPIC, so I did not have to fax the chart. Vassie Moselle 11/17/2011

## 2011-11-18 ENCOUNTER — Telehealth (HOSPITAL_COMMUNITY): Payer: Self-pay | Admitting: *Deleted

## 2011-11-18 NOTE — ED Notes (Signed)
[id number] Call on VM from Mel at [facility]. 1234 I called back and left a message to call.  She called me back and said they offered the patient Mon. 6/24 at 0800 and pt. declined.  She said she had to work and said she needed Tues. They could not do that due to physicians being out on vacation.  I asked if she still has the appt. 7/10 and she said yes.  I told her I do not understand because, she could get a work note. I thanked her for her help. Vassie Moselle 11/18/2011

## 2011-12-07 ENCOUNTER — Encounter: Payer: Self-pay | Admitting: Obstetrics and Gynecology

## 2011-12-26 ENCOUNTER — Ambulatory Visit (INDEPENDENT_AMBULATORY_CARE_PROVIDER_SITE_OTHER): Payer: BC Managed Care – PPO | Admitting: Obstetrics and Gynecology

## 2011-12-26 ENCOUNTER — Encounter: Payer: Self-pay | Admitting: Obstetrics and Gynecology

## 2011-12-26 VITALS — BP 122/64 | Ht 67.0 in | Wt 174.0 lb

## 2011-12-26 DIAGNOSIS — R102 Pelvic and perineal pain: Secondary | ICD-10-CM

## 2011-12-26 DIAGNOSIS — N949 Unspecified condition associated with female genital organs and menstrual cycle: Secondary | ICD-10-CM

## 2011-12-26 NOTE — Progress Notes (Signed)
C/o pelvic pain since June 8-9/10 qod  Filed Vitals:   12/26/11 1433  BP: 122/64   ROS: noncontributory  Pelvic exam:  VULVA: normal appearing vulva with no masses, tenderness or lesions,  VAGINA: normal appearing vagina with normal color and discharge, no lesions, CERVIX: normal appearing cervix without discharge or lesions,  UTERUS: uterus is normal size, shape, consistency and nontender,  ADNEXA: normal adnexa in size, nontender and no masses.  A/P Trial of Motrin Sched u/s next available Pt likes IUD otherwise and hopes to keep it (placed 01/2011 after an SAB) GC/CT today with consent

## 2011-12-27 LAB — GC/CHLAMYDIA PROBE AMP, GENITAL
Chlamydia, DNA Probe: NEGATIVE
GC Probe Amp, Genital: NEGATIVE

## 2012-01-02 ENCOUNTER — Telehealth: Payer: Self-pay

## 2012-01-02 NOTE — Telephone Encounter (Signed)
LM for pt to call back re: Appt time for U/S to eval pain w/ IUD. I offered appts for 01/09/2012 @ 9:00 for u/s and 9:45 for Follow up w/ EP. Dr. Su Hilt is not available that day. I will book these appts to save them. Melody Comas A

## 2012-01-09 ENCOUNTER — Ambulatory Visit (INDEPENDENT_AMBULATORY_CARE_PROVIDER_SITE_OTHER): Payer: BC Managed Care – PPO

## 2012-01-09 ENCOUNTER — Ambulatory Visit (INDEPENDENT_AMBULATORY_CARE_PROVIDER_SITE_OTHER): Payer: BC Managed Care – PPO | Admitting: Obstetrics and Gynecology

## 2012-01-09 ENCOUNTER — Encounter: Payer: Self-pay | Admitting: Obstetrics and Gynecology

## 2012-01-09 ENCOUNTER — Other Ambulatory Visit: Payer: Self-pay | Admitting: Obstetrics and Gynecology

## 2012-01-09 VITALS — BP 100/68 | Temp 98.6°F | Wt 174.0 lb

## 2012-01-09 DIAGNOSIS — N949 Unspecified condition associated with female genital organs and menstrual cycle: Secondary | ICD-10-CM

## 2012-01-09 DIAGNOSIS — R102 Pelvic and perineal pain: Secondary | ICD-10-CM

## 2012-01-09 NOTE — Progress Notes (Addendum)
25 YO seen at end of July for pelvic pain presents today for ultrasound.  Patient has a Mirena IUD placed in 01/2011 after SAB.  Patient has been having sharp pain in lower middle abdomen-fleeting without any provocation.  Has positional dyspareunia but no urinary tract symptoms, changes in bowel movements, or heavy lifting/exercises that could have caused pulled muscles in pelvic.  Has not had previous abdominal surgery.  O: Ultrasound: wnl with IUD in proper position per 3 D imaging   A:  Pelvic Pain  P: Reviewed causes of pelvic pain: urogenital, previous surgery, gastrointestinal and musculoskeletal.      Follow up with PCP for non-gyn causes of pelvic pain      RTO-as scheduled or prn.  Ivana Nicastro, PA-C

## 2012-02-21 ENCOUNTER — Emergency Department (HOSPITAL_COMMUNITY)
Admission: EM | Admit: 2012-02-21 | Discharge: 2012-02-22 | Disposition: A | Discharge status: Home / Self Care | Payer: Self-pay | Attending: Emergency Medicine | Admitting: Emergency Medicine

## 2012-02-21 ENCOUNTER — Encounter (HOSPITAL_COMMUNITY): Payer: Self-pay | Admitting: Emergency Medicine

## 2012-02-21 DIAGNOSIS — R29898 Other symptoms and signs involving the musculoskeletal system: Secondary | ICD-10-CM | POA: Insufficient documentation

## 2012-02-21 DIAGNOSIS — R202 Paresthesia of skin: Secondary | ICD-10-CM

## 2012-02-21 DIAGNOSIS — R200 Anesthesia of skin: Secondary | ICD-10-CM

## 2012-02-21 DIAGNOSIS — R209 Unspecified disturbances of skin sensation: Secondary | ICD-10-CM | POA: Insufficient documentation

## 2012-02-21 LAB — CBC WITH DIFFERENTIAL/PLATELET
Basophils Absolute: 0 10*3/uL (ref 0.0–0.1)
Basophils Relative: 1 % (ref 0–1)
Eosinophils Absolute: 0.1 10*3/uL (ref 0.0–0.7)
Eosinophils Relative: 2 % (ref 0–5)
Lymphocytes Relative: 39 % (ref 12–46)
MCHC: 34.7 g/dL (ref 30.0–36.0)
MCV: 90 fL (ref 78.0–100.0)
Platelets: 165 10*3/uL (ref 150–400)
RDW: 12.2 % (ref 11.5–15.5)
WBC: 7.8 10*3/uL (ref 4.0–10.5)

## 2012-02-21 LAB — BASIC METABOLIC PANEL
BUN: 9 mg/dL (ref 6–23)
CO2: 23 mEq/L (ref 19–32)
Calcium: 9.7 mg/dL (ref 8.4–10.5)
Creatinine, Ser: 0.66 mg/dL (ref 0.50–1.10)

## 2012-02-21 NOTE — ED Notes (Signed)
Pt c/o right sided numbness x 3 weeks in leg and arm; pt sts some tingling; pt denies other complaint

## 2012-02-21 NOTE — ED Provider Notes (Signed)
History     CSN: [id number]  Arrival date & time 02/21/12  1814   First MD Initiated Contact with Patient 02/21/12 2343      Chief Complaint  Patient presents with  . Numbness    (Consider location/radiation/quality/duration/timing/severity/associated sxs/prior treatment) HPI Comments: This is a 26 year old female with no PMH, who presents to the ED with a 3 week history of numbness and tingling in her right lower extremities.  She was recently told by the health department that she might have had a mini stroke, and was instructed to follow-up here.  The patient states that the numbness and tingling is worse at night when she lays down. She states that the symptoms radiate to her lower right leg and foot and sometimes effect her right hand. She denies any trauma or mechanism of injury.  She denies any bowel or bladder dysfunction.  Denies any pain in the back or neck. She states that this is the first time that she has experienced these types of symptoms. Patient denies any history of visual disturbance.  The history is provided by the patient. No language interpreter was used.    Past Medical History  Diagnosis Date  . Anemia   . H/O varicella   . Migraine   . Irregular periods/menstrual cycles   . Abnormal Pap smear 2012  . Low iron     Past Surgical History  Procedure Date  . Dilation and curettage of uterus june 2012    Family History  Problem Relation Age of Onset  . Breast cancer Maternal Grandmother   . Cancer Maternal Grandmother     OVARIAN    History  Substance Use Topics  . Smoking status: Current Every Day Smoker -- 0.5 packs/day for 5 years    Types: Cigarettes  . Smokeless tobacco: Never Used  . Alcohol Use: 0.0 oz/week    0 drink(s) per week    OB History    Grav Para Term Preterm Abortions TAB SAB Ect Mult Living   3 1 1  0 2 1 1  0 0 1      Review of Systems  HENT: Negative for neck pain and neck stiffness.   Respiratory: Negative for  shortness of breath.   Cardiovascular: Negative for chest pain.  Neurological: Positive for weakness and numbness. Negative for dizziness and speech difficulty.       Right lower extremity numbness and tingling with associated weakness  All other systems reviewed and are negative.    Allergies  Review of patient's allergies indicates no known allergies.  Home Medications   Current Outpatient Rx  Name Route Sig Dispense Refill  . SULFAMETHOXAZOLE-TMP DS 800-160 MG PO TABS Oral Take 1 tablet by mouth every other day.      BP 114/66  Pulse 89  Temp 98.5 F (36.9 C) (Oral)  Resp 18  SpO2 95%  Physical Exam  Nursing note and vitals reviewed. Constitutional: She is oriented to person, place, and time. She appears well-developed and well-nourished.  HENT:  Head: Normocephalic and atraumatic.  Eyes: Conjunctivae normal and EOM are normal. Pupils are equal, round, and reactive to light.  Neck: Normal range of motion. Neck supple.  Cardiovascular: Normal rate, regular rhythm and normal heart sounds.   Pulmonary/Chest: Effort normal and breath sounds normal.  Abdominal: Soft. Bowel sounds are normal.  Musculoskeletal:       Right lower extremity ROM 5/5 Strength 4/5 Sensation: abnormal 2 point discrimination, position sense, sharp/dull Normal gait  Neurological: She is alert and oriented to person, place, and time.       Reflexes  R patella: 1+ L patella: 2+ Normal CN 3-12  Skin: Skin is warm and dry.  Psychiatric: She has a normal mood and affect. Her behavior is normal. Judgment and thought content normal.    ED Course  Procedures (including critical care time)   Labs Reviewed  BASIC METABOLIC PANEL  CBC WITH DIFFERENTIAL  URINALYSIS, ROUTINE W REFLEX MICROSCOPIC    Results for orders placed during the hospital encounter of 02/21/12  BASIC METABOLIC PANEL      Component Value Range   Sodium 140  135 - 145 mEq/L   Potassium 4.1  3.5 - 5.1 mEq/L   Chloride 105   96 - 112 mEq/L   CO2 23  19 - 32 mEq/L   Glucose, Bld 96  70 - 99 mg/dL   BUN 9  6 - 23 mg/dL   Creatinine, Ser 1.61  0.50 - 1.10 mg/dL   Calcium 9.7  8.4 - 09.6 mg/dL   GFR calc non Af Amer >90  >90 mL/min   GFR calc Af Amer >90  >90 mL/min  CBC WITH DIFFERENTIAL      Component Value Range   WBC 7.8  4.0 - 10.5 K/uL   RBC 4.61  3.87 - 5.11 MIL/uL   Hemoglobin 14.4  12.0 - 15.0 g/dL   HCT 04.5  40.9 - 81.1 %   MCV 90.0  78.0 - 100.0 fL   MCH 31.2  26.0 - 34.0 pg   MCHC 34.7  30.0 - 36.0 g/dL   RDW 91.4  78.2 - 95.6 %   Platelets 165  150 - 400 K/uL   Neutrophils Relative 53  43 - 77 %   Neutro Abs 4.2  1.7 - 7.7 K/uL   Lymphocytes Relative 39  12 - 46 %   Lymphs Abs 3.0  0.7 - 4.0 K/uL   Monocytes Relative 6  3 - 12 %   Monocytes Absolute 0.5  0.1 - 1.0 K/uL   Eosinophils Relative 2  0 - 5 %   Eosinophils Absolute 0.1  0.0 - 0.7 K/uL   Basophils Relative 1  0 - 1 %   Basophils Absolute 0.0  0.0 - 0.1 K/uL  URINALYSIS, ROUTINE W REFLEX MICROSCOPIC      Component Value Range   Color, Urine YELLOW  YELLOW   APPearance CLEAR  CLEAR   Specific Gravity, Urine 1.022  1.005 - 1.030   pH 6.0  5.0 - 8.0   Glucose, UA NEGATIVE  NEGATIVE mg/dL   Hgb urine dipstick NEGATIVE  NEGATIVE   Bilirubin Urine NEGATIVE  NEGATIVE   Ketones, ur 15 (*) NEGATIVE mg/dL   Protein, ur NEGATIVE  NEGATIVE mg/dL   Urobilinogen, UA 1.0  0.0 - 1.0 mg/dL   Nitrite NEGATIVE  NEGATIVE   Leukocytes, UA NEGATIVE  NEGATIVE  POCT PREGNANCY, URINE      Component Value Range   Preg Test, Ur NEGATIVE  NEGATIVE   Ct Head Wo Contrast  02/22/2012  *RADIOLOGY REPORT*  Clinical Data: 26 year old female right side numbness in the extremities.  CT HEAD WITHOUT CONTRAST  Technique:  Contiguous axial images were obtained from the base of the skull through the vertex without contrast.  Comparison: None.  Findings: Small frontal recess sinus ostium is incidentally noted. Visualized paranasal sinuses and mastoids are clear.   No acute osseous abnormality identified.  Visualized orbit soft  tissues are within normal limits.  Visualized scalp soft tissues are within normal limits.  Cerebral volume is within normal limits for age.  No midline shift, ventriculomegaly, mass effect, evidence of mass lesion, intracranial hemorrhage or evidence of cortically based acute infarction.  Gray-white matter differentiation is within normal limits throughout the brain.  No suspicious intracranial vascular hyperdensity.  IMPRESSION: Normal noncontrast CT appearance of the brain.   Original Report Authenticated By: Ulla Potash III, M.D.        1. Numbness and tingling of right arm and leg   2. Weakness of right leg       MDM  This is a 26 year old female who presents with a 3 week history of numbness and weakness in her right lower extremity.  This is her first complaint of this nature.  CT negative for acute process. Will discharge to home and have patient followup with neurology for further evaluation of numbness and weakness. This patient has been discussed with Dr. Hyacinth Meeker who agrees with the treatment plan.        Roxy Horseman, PA-C 02/22/12 0147

## 2012-02-22 ENCOUNTER — Encounter (HOSPITAL_COMMUNITY): Payer: Self-pay | Admitting: Radiology

## 2012-02-22 ENCOUNTER — Emergency Department (HOSPITAL_COMMUNITY): Payer: Self-pay

## 2012-02-22 LAB — URINALYSIS, ROUTINE W REFLEX MICROSCOPIC
Bilirubin Urine: NEGATIVE
Ketones, ur: 15 mg/dL — AB
Leukocytes, UA: NEGATIVE
Nitrite: NEGATIVE
Protein, ur: NEGATIVE mg/dL
Urobilinogen, UA: 1 mg/dL (ref 0.0–1.0)

## 2012-02-22 NOTE — ED Notes (Signed)
Patient transported to CT 

## 2012-02-22 NOTE — ED Provider Notes (Signed)
7 her old female who presents with a complaint of right lower extremity numbness and weakness and tingling. She states this started approximately 3 weeks ago, has been overall very persistent but does wax and wane throughout the day. She denies any problems with the left side of her body but does admit to having intermittently right upper extremity tingling and numbness. This seems to get worse when she uses the keyboard but is a stocking glove distribution and not dermatomal. Her right lower extremity is persistently weak and numb, not swollen, not red, not inflamed. She has no pain in this area. Nothing seems to make this better or worse though maybe it's a slightly improved when she walks. She denies any history of family history of multiple sclerosis or other demyelinating diseases. She denies visual changes, changes in speech.  PE:  Cardiac exam reveals no murmurs, no tachycardia, regular rhythm. Strong peripheral artery pulses at the radial arteries, soft abdomen, clear lungs without any abnormalities. Cranial nerves III through XII intact, bilateral grips are normal, sensation of the right upper extremity and left upper extremities normal to light touch and pinprick. Lower extremities have asymmetrical sensation with left lower extremity normal sensation to light touch pinprick position sense and temperature sensation. The right lower extremity has decreased sensation to light touch pinprick, inability to differentiate temperature and positional such at the toes. Reflexes are decreased bilaterally but no asymmetry. Strength in the right lower extremity is 4+ out of 5 at the knee and the ankle and the hip. Normal rapid alternating movements, normal finger-nose-finger, no pronator drift, no truncal ataxia or limb ataxia. Speech is very clear.  Assessment:  The patient appears to have focal neurologic deficits the right lower extremity which seem to wax and wane throughout the day and the last several weeks  as well as right upper extremity which she will actually throughout the day as well. Currently she has objective findings of weakness numbness to the right lower extremity, CT scan of the head to rule out stroke though she does not have any risk factors for this. She will likely need MRI in the outpatient setting with neurologic followup to rule out a demyelinating disease. This was discussed with the patient and she is expressed her understanding.  I have discussed these findings with Dr. Amada Jupiter (sp?) with Neuro who agrees with outpt f/u.  Pt ambulated without difficulty prior to d/c  Medical screening examination/treatment/procedure(s) were conducted as a shared visit with non-physician practitioner(s) and myself.  I personally evaluated the patient during the encounter    Vida Roller, MD 02/22/12 [phone]

## 2012-02-22 NOTE — ED Notes (Signed)
Patient presents stating that she has had numbness on her right side for 3 weeks.  No other complaints

## 2012-02-22 NOTE — ED Provider Notes (Signed)
Medical screening examination/treatment/procedure(s) were conducted as a shared visit with non-physician practitioner(s) and myself.  I personally evaluated the patient during the encounter  Please see my separate respective documentation pertaining to this patient encounter   Vida Roller, MD 02/22/12 [phone]

## 2012-10-16 ENCOUNTER — Encounter (HOSPITAL_COMMUNITY): Payer: Self-pay

## 2012-10-16 ENCOUNTER — Ambulatory Visit (HOSPITAL_COMMUNITY)
Admission: RE | Admit: 2012-10-16 | Discharge: 2012-10-16 | Disposition: A | Discharge status: Home / Self Care | Payer: BC Managed Care – PPO | Source: Ambulatory Visit | Attending: Obstetrics and Gynecology | Admitting: Obstetrics and Gynecology

## 2012-10-16 VITALS — BP 102/60 | Temp 98.1°F | Ht 67.0 in | Wt 190.4 lb

## 2012-10-16 DIAGNOSIS — Z1239 Encounter for other screening for malignant neoplasm of breast: Secondary | ICD-10-CM

## 2012-10-16 NOTE — Progress Notes (Signed)
Complaints of left breast swelling x 2-3 months.  Pap Smear:    Pap smear not completed today. Last Pap smear was September 2013 at the [facility] and normal. Per patient has a history of one abnormal Pap smear 3-4 years ago that a repeat Pap smear was completed for follow up. No Pap smear results in EPIC.  Physical exam: Breasts Left breast larger than right breast. Per patient left breast has gotten larger over the past 2-3 months. No skin abnormalities bilateral breasts. No nipple retraction bilateral breasts. No nipple discharge bilateral breasts. No lymphadenopathy. No lumps palpated bilateral breasts. No complaints of pain or tenderness on exam. Referred patient to [facility] for left breast ultrasound. Appointment scheduled for Wednesday, Oct 17, 2012 at 1100.  Pelvic/Bimanual No Pap smear completed today since last Pap smear was September 2013. Pap smear not indicated per [facility] guidelines.

## 2012-10-16 NOTE — Patient Instructions (Addendum)
Taught patient how to perform BSE. Patient did not need a Pap smear today due to last Pap smear was September 2013. Due to patients recent history of an abnormal Pap smear her next Pap smear will be due September 2014. Let her know she can come to [facility] to have Pap smear done or can go to the [facility]. Referred patient to [facility] for left breast ultrasound. Appointment scheduled for Wednesday, Oct 17, 2012 at 1100. Patient aware of appointment and will be there. Patient verbalized understanding.

## 2012-10-17 ENCOUNTER — Encounter (HOSPITAL_COMMUNITY): Payer: Self-pay

## 2013-09-29 ENCOUNTER — Inpatient Hospital Stay (HOSPITAL_COMMUNITY): Payer: Self-pay

## 2013-09-29 ENCOUNTER — Encounter (HOSPITAL_COMMUNITY): Payer: Self-pay | Admitting: Family

## 2013-09-29 ENCOUNTER — Inpatient Hospital Stay (HOSPITAL_COMMUNITY)
Admission: AD | Admit: 2013-09-29 | Discharge: 2013-09-29 | Disposition: A | Discharge status: Home / Self Care | Payer: Medicaid Other | Source: Ambulatory Visit | Attending: Obstetrics & Gynecology | Admitting: Obstetrics & Gynecology

## 2013-09-29 DIAGNOSIS — N926 Irregular menstruation, unspecified: Secondary | ICD-10-CM | POA: Insufficient documentation

## 2013-09-29 DIAGNOSIS — D649 Anemia, unspecified: Secondary | ICD-10-CM | POA: Insufficient documentation

## 2013-09-29 DIAGNOSIS — N920 Excessive and frequent menstruation with regular cycle: Secondary | ICD-10-CM

## 2013-09-29 DIAGNOSIS — B9689 Other specified bacterial agents as the cause of diseases classified elsewhere: Secondary | ICD-10-CM | POA: Insufficient documentation

## 2013-09-29 DIAGNOSIS — N760 Acute vaginitis: Secondary | ICD-10-CM | POA: Insufficient documentation

## 2013-09-29 DIAGNOSIS — A499 Bacterial infection, unspecified: Secondary | ICD-10-CM | POA: Insufficient documentation

## 2013-09-29 DIAGNOSIS — N946 Dysmenorrhea, unspecified: Secondary | ICD-10-CM | POA: Insufficient documentation

## 2013-09-29 LAB — COMPREHENSIVE METABOLIC PANEL
ALK PHOS: 70 U/L (ref 39–117)
ALT: 12 U/L (ref 0–35)
AST: 14 U/L (ref 0–37)
Albumin: 4 g/dL (ref 3.5–5.2)
BUN: 9 mg/dL (ref 6–23)
CALCIUM: 9.2 mg/dL (ref 8.4–10.5)
CO2: 29 meq/L (ref 19–32)
Chloride: 104 mEq/L (ref 96–112)
Creatinine, Ser: 0.73 mg/dL (ref 0.50–1.10)
GLUCOSE: 73 mg/dL (ref 70–99)
Potassium: 4.1 mEq/L (ref 3.7–5.3)
SODIUM: 144 meq/L (ref 137–147)
TOTAL PROTEIN: 6.6 g/dL (ref 6.0–8.3)
Total Bilirubin: 0.3 mg/dL (ref 0.3–1.2)

## 2013-09-29 LAB — WET PREP, GENITAL
Trich, Wet Prep: NONE SEEN
Yeast Wet Prep HPF POC: NONE SEEN

## 2013-09-29 LAB — URINALYSIS, ROUTINE W REFLEX MICROSCOPIC
Bilirubin Urine: NEGATIVE
Glucose, UA: NEGATIVE mg/dL
Ketones, ur: NEGATIVE mg/dL
Leukocytes, UA: NEGATIVE
Nitrite: NEGATIVE
Protein, ur: NEGATIVE mg/dL
Specific Gravity, Urine: 1.015 (ref 1.005–1.030)
Urobilinogen, UA: 1 mg/dL (ref 0.0–1.0)
pH: 6.5 (ref 5.0–8.0)

## 2013-09-29 LAB — URINE MICROSCOPIC-ADD ON

## 2013-09-29 LAB — CBC
HEMATOCRIT: 43 % (ref 36.0–46.0)
Hemoglobin: 14.8 g/dL (ref 12.0–15.0)
MCH: 31.5 pg (ref 26.0–34.0)
MCHC: 34.4 g/dL (ref 30.0–36.0)
MCV: 91.5 fL (ref 78.0–100.0)
PLATELETS: 189 10*3/uL (ref 150–400)
RBC: 4.7 MIL/uL (ref 3.87–5.11)
RDW: 12.5 % (ref 11.5–15.5)
WBC: 8.8 10*3/uL (ref 4.0–10.5)

## 2013-09-29 LAB — POCT PREGNANCY, URINE: Preg Test, Ur: NEGATIVE

## 2013-09-29 MED ORDER — IBUPROFEN 600 MG PO TABS
600.0000 mg | ORAL_TABLET | Freq: Once | ORAL | Status: AC
  Administered 2013-09-29: 600 mg via ORAL
  Filled 2013-09-29: qty 1

## 2013-09-29 MED ORDER — METRONIDAZOLE 500 MG PO TABS
500.0000 mg | ORAL_TABLET | Freq: Two times a day (BID) | ORAL | Status: DC
Start: 2013-09-29 — End: 2015-03-17

## 2013-09-29 MED ORDER — NORGESTIMATE-ETH ESTRADIOL 0.25-35 MG-MCG PO TABS: 1.0000 | ORAL_TABLET | Freq: Every day | ORAL | Status: DC

## 2013-09-29 MED ORDER — IBUPROFEN 600 MG PO TABS: 600.0000 mg | ORAL_TABLET | Freq: Four times a day (QID) | ORAL | Status: DC | PRN

## 2013-09-29 NOTE — MAU Note (Signed)
28 yo, G3P1 with LMP 5/1 presents with c/o of increased bleeding with this cycle and passing quarter-sized clots; she rates her bilateral pelvic cramping 7/10.  States her last cycle was also heavier, not to this extent. She feels light-headed. Denies LOC, heart palpitions, or medication/drug changes.

## 2013-09-29 NOTE — Discharge Instructions (Signed)
Abnormal Uterine Bleeding Abnormal uterine bleeding can affect women at various stages in life, including teenagers, women in their reproductive years, pregnant women, and women who have reached menopause. Several kinds of uterine bleeding are considered abnormal, including:  Bleeding or spotting between periods.   Bleeding after sexual intercourse.   Bleeding that is heavier or more than normal.   Periods that last longer than usual.  Bleeding after menopause.  Many cases of abnormal uterine bleeding are minor and simple to treat, while others are more serious. Any type of abnormal bleeding should be evaluated by your health care provider. Treatment will depend on the cause of the bleeding. HOME CARE INSTRUCTIONS Monitor your condition for any changes. The following actions may help to alleviate any discomfort you are experiencing:  Avoid the use of tampons and douches as directed by your health care provider.  Change your pads frequently. You should get regular pelvic exams and Pap tests. Keep all follow-up appointments for diagnostic tests as directed by your health care provider.  SEEK MEDICAL CARE IF:   Your bleeding lasts more than 1 week.   You feel dizzy at times.  SEEK IMMEDIATE MEDICAL CARE IF:   You pass out.   You are changing pads every 15 to 30 minutes.   You have abdominal pain.  You have a fever.   You become sweaty or weak.   You are passing large blood clots from the vagina.   You start to feel nauseous and vomit. MAKE SURE YOU:   Understand these instructions.  Will watch your condition.  Will get help right away if you are not doing well or get worse. Document Released: 05/16/2005 Document Revised: 01/16/2013 Document Reviewed: 12/13/2012 [facility] Patient Information 2014 [place], [place].  Bacterial Vaginosis Bacterial vaginosis is a vaginal infection that occurs when the normal balance of bacteria in the vagina is disrupted. It  results from an overgrowth of certain bacteria. This is the most common vaginal infection in women of childbearing age. Treatment is important to prevent complications, especially in pregnant women, as it can cause a premature delivery. CAUSES  Bacterial vaginosis is caused by an increase in harmful bacteria that are normally present in smaller amounts in the vagina. Several different kinds of bacteria can cause bacterial vaginosis. However, the reason that the condition develops is not fully understood. RISK FACTORS Certain activities or behaviors can put you at an increased risk of developing bacterial vaginosis, including:  Having a new sex partner or multiple sex partners.  Douching.  Using an intrauterine device (IUD) for contraception. Women do not get bacterial vaginosis from toilet seats, bedding, swimming pools, or contact with objects around them. SIGNS AND SYMPTOMS  Some women with bacterial vaginosis have no signs or symptoms. Common symptoms include:  Grey vaginal discharge.  A fishlike odor with discharge, especially after sexual intercourse.  Itching or burning of the vagina and vulva.  Burning or pain with urination. DIAGNOSIS  Your health care provider will take a medical history and examine the vagina for signs of bacterial vaginosis. A sample of vaginal fluid may be taken. Your health care provider will look at this sample under a microscope to check for bacteria and abnormal cells. A vaginal pH test may also be done.  TREATMENT  Bacterial vaginosis may be treated with antibiotic medicines. These may be given in the form of a pill or a vaginal cream. A second round of antibiotics may be prescribed if the condition comes back after treatment.  HOME CARE INSTRUCTIONS   Only take over-the-counter or prescription medicines as directed by your health care provider.  If antibiotic medicine was prescribed, take it as directed. Make sure you finish it even if you start to  feel better.  Do not have sex until treatment is completed.  Tell all sexual partners that you have a vaginal infection. They should see their health care provider and be treated if they have problems, such as a mild rash or itching.  Practice safe sex by using condoms and only having one sex partner. SEEK MEDICAL CARE IF:   Your symptoms are not improving after 3 days of treatment.  You have increased discharge or pain.  You have a fever. MAKE SURE YOU:   Understand these instructions.  Will watch your condition.  Will get help right away if you are not doing well or get worse. FOR MORE INFORMATION  Centers for Disease Control and Prevention, Division of STD Prevention: SolutionApps.co.za American Sexual Health Association (ASHA): www.ashastd.org  Document Released: 05/16/2005 Document Revised: 03/06/2013 Document Reviewed: 12/26/2012 [facility] Patient Information 2014 [place], [place].  Dysmenorrhea Menstrual cramps (dysmenorrhea) are caused by the muscles of the uterus tightening (contracting) during a menstrual period. For some women, this discomfort is merely bothersome. For others, dysmenorrhea can be severe enough to interfere with everyday activities for a few days each month. Primary dysmenorrhea is menstrual cramps that last a couple of days when you start having menstrual periods or soon after. This often begins after a teenager starts having her period. As a woman gets older or has a baby, the cramps will usually lessen or disappear. Secondary dysmenorrhea begins later in life, lasts longer, and the pain may be stronger than primary dysmenorrhea. The pain may start before the period and last a few days after the period.  CAUSES  Dysmenorrhea is usually caused by an underlying problem, such as:  The tissue lining the uterus grows outside of the uterus in other areas of the body (endometriosis).  The endometrial tissue, which normally lines the uterus, is found in or grows  into the muscular walls of the uterus (adenomyosis).  The pelvic blood vessels are engorged with blood just before the menstrual period (pelvic congestive syndrome).  Overgrowth of cells (polyps) in the lining of the uterus or cervix.  Falling down of the uterus (prolapse) because of loose or stretched ligaments.  Depression.  Bladder problems, infection, or inflammation.  Problems with the intestine, a tumor, or irritable bowel syndrome.  Cancer of the female organs or bladder.  A severely tipped uterus.  A very tight opening or closed cervix.  Noncancerous tumors of the uterus (fibroids).  Pelvic inflammatory disease (PID).  Pelvic scarring (adhesions) from a previous surgery.  Ovarian cyst.  An intrauterine device (IUD) used for birth control. RISK FACTORS You may be at greater risk of dysmenorrhea if:  You are younger than age 64.  You started puberty early.  You have irregular or heavy bleeding.  You have never given birth.  You have a family history of this problem.  You are a smoker. SIGNS AND SYMPTOMS   Cramping or throbbing pain in your lower abdomen.  Headaches.  Lower back pain.  Nausea or vomiting.  Diarrhea.  Sweating or dizziness.  Loose stools. DIAGNOSIS  A diagnosis is based on your history, symptoms, physical exam, diagnostic tests, or procedures. Diagnostic tests or procedures may include:  Blood tests.  Ultrasonography.  An examination of the lining of the uterus (dilation and  curettage, D&C).  An examination inside your abdomen or pelvis with a scope (laparoscopy).  X-rays.  CT scan.  MRI.  An examination inside the bladder with a scope (cystoscopy).  An examination inside the intestine or stomach with a scope (colonoscopy, gastroscopy). TREATMENT  Treatment depends on the cause of the dysmenorrhea. Treatment may include:  Pain medicine prescribed by your health care provider.  Birth control pills or an IUD with  progesterone hormone in it.  Hormone replacement therapy.  Nonsteroidal anti-inflammatory drugs (NSAIDs). These may help stop the production of prostaglandins.  Surgery to remove adhesions, endometriosis, ovarian cyst, or fibroids.  Removal of the uterus (hysterectomy).  Progesterone shots to stop the menstrual period.  Cutting the nerves on the sacrum that go to the female organs (presacral neurectomy).  Electric current to the sacral nerves (sacral nerve stimulation).  Antidepressant medicine.  Psychiatric therapy, counseling, or group therapy.  Exercise and physical therapy.  Meditation and yoga therapy.  Acupuncture. HOME CARE INSTRUCTIONS   Only take over-the-counter or prescription medicines as directed by your health care provider.  Place a heating pad or hot water bottle on your lower back or abdomen. Do not sleep with the heating pad.  Use aerobic exercises, walking, swimming, biking, and other exercises to help lessen the cramping.  Massage to the lower back or abdomen may help.  Stop smoking.  Avoid alcohol and caffeine. SEEK MEDICAL CARE IF:   Your pain does not get better with medicine.  You have pain with sexual intercourse.  Your pain increases and is not controlled with medicines.  You have abnormal vaginal bleeding with your period.  You develop nausea or vomiting with your period that is not controlled with medicine. SEEK IMMEDIATE MEDICAL CARE IF:  You pass out.  Document Released: 05/16/2005 Document Revised: 01/16/2013 Document Reviewed: 11/01/2012 [facility] Patient Information 2014 [place], [place].

## 2013-09-29 NOTE — MAU Provider Note (Signed)
Attestation of Attending Supervision of Advanced Practitioner (CNM/NP): Evaluation and management procedures were performed by the Advanced Practitioner under my supervision and collaboration. I have reviewed the Advanced Practitioner's note and chart, and I agree with the management and plan.  Tennille Montelongo H Juanita Streight 7:23 PM   

## 2013-09-29 NOTE — MAU Provider Note (Signed)
Chief Complaint: Vaginal Bleeding  First Provider Initiated Contact with Patient 09/29/13 1440     SUBJECTIVE HPI: Teresa Farley is a 28 y.o. [id number] female, LMP 09/27/2013 who presents with heavy menstrual bleeding with small clots and moderate-severe menstrual cramps. Had heavy period last month, but no other history of menorrhagia. Rates cramps 7/10 on pain scale. Describes them as intermittent, throbbing, bilateral. Patient states she is sexually active and not using anything for birth control. Last birth control with Mirena IUD, but had it removed a few years ago. She states she is in a mutually monogamous relationship. Has not taken a pregnancy test. Does not have a gynecologist.  Past Medical History  Diagnosis Date  . Anemia   . H/O varicella   . Migraine   . Irregular periods/menstrual cycles   . Abnormal Pap smear 2012  . Low iron    OB History  Gravida Para Term Preterm AB SAB TAB Ectopic Multiple Living  3 1 1  0 2 1 1  0 0 1    # Outcome Date GA Lbr Len/2nd Weight Sex Delivery Anes PTL Lv  3 TRM 08/21/06 [redacted]w[redacted]d  3.215 kg (7 lb 1.4 oz) F VAC EPI  Y  2 TAB     U    N  1 SAB     U    N     Past Surgical History  Procedure Laterality Date  . Dilation and curettage of uterus  june 2012   History   Social History  . Marital Status: Single    Spouse Name: N/A    Number of Children: N/A  . Years of Education: N/A   Occupational History  . Not on file.   Social History Main Topics  . Smoking status: Current Every Day Smoker -- 0.50 packs/day for 5 years    Types: Cigarettes  . Smokeless tobacco: Never Used  . Alcohol Use: 0.0 oz/week    0 drink(s) per week     Comment: occassionally  . Drug Use: No  . Sexual Activity: Yes    Birth Control/ Protection: None   Other Topics Concern  . Not on file   Social History Narrative  . No narrative on file   No current facility-administered medications on file prior to encounter.   No current outpatient prescriptions  on file prior to encounter.   No Known Allergies  ROS: Positive for vaginal bleeding, low abdominal cramping, light-headedness. Negative for fever, chills, syncope, vaginal discharge, dyspareunia, urinary complaints or GI complaints.  OBJECTIVE Blood pressure 122/79, pulse 100, temperature 97.7 F (36.5 C), temperature source Oral, resp. rate 15, SpO2 99.00%. GENERAL: Well-developed, well-nourished female in no acute distress. No cyanosis. HEENT: Normocephalic HEART: normal rate RESP: normal effort ABDOMEN: Soft, non-tender. No CVA tenderness. EXTREMITIES: Nontender, no edema NEURO: Alert and oriented SPECULUM EXAM: NEFG, small amount of bright red blood noted, cervix clean, nonfriable. No polyps. BIMANUAL: cervix closed; uterus normal size, no adnexal tenderness or masses. No vertical motion tenderness.  LAB RESULTS Results for orders placed during the hospital encounter of 09/29/13 (from the past 24 hour(s))  URINALYSIS, ROUTINE W REFLEX MICROSCOPIC     Status: Abnormal   Collection Time    09/29/13  2:00 PM      Result Value Ref Range   Color, Urine YELLOW  YELLOW   APPearance CLEAR  CLEAR   Specific Gravity, Urine 1.015  1.005 - 1.030   pH 6.5  5.0 - 8.0   Glucose, UA NEGATIVE  NEGATIVE mg/dL   Hgb urine dipstick MODERATE (*) NEGATIVE   Bilirubin Urine NEGATIVE  NEGATIVE   Ketones, ur NEGATIVE  NEGATIVE mg/dL   Protein, ur NEGATIVE  NEGATIVE mg/dL   Urobilinogen, UA 1.0  0.0 - 1.0 mg/dL   Nitrite NEGATIVE  NEGATIVE   Leukocytes, UA NEGATIVE  NEGATIVE  URINE MICROSCOPIC-ADD ON     Status: Abnormal   Collection Time    09/29/13  2:00 PM      Result Value Ref Range   Squamous Epithelial / LPF FEW (*) RARE   RBC / HPF 7-10  <3 RBC/hpf   Urine-Other MUCOUS PRESENT    POCT PREGNANCY, URINE     Status: None   Collection Time    09/29/13  2:12 PM      Result Value Ref Range   Preg Test, Ur NEGATIVE  NEGATIVE  COMPREHENSIVE METABOLIC PANEL     Status: None   Collection  Time    09/29/13  2:37 PM      Result Value Ref Range   Sodium 144  137 - 147 mEq/L   Potassium 4.1  3.7 - 5.3 mEq/L   Chloride 104  96 - 112 mEq/L   CO2 29  19 - 32 mEq/L   Glucose, Bld 73  70 - 99 mg/dL   BUN 9  6 - 23 mg/dL   Creatinine, Ser 1.610.73  0.50 - 1.10 mg/dL   Calcium 9.2  8.4 - 09.610.5 mg/dL   Total Protein 6.6  6.0 - 8.3 g/dL   Albumin 4.0  3.5 - 5.2 g/dL   AST 14  0 - 37 U/L   ALT 12  0 - 35 U/L   Alkaline Phosphatase 70  39 - 117 U/L   Total Bilirubin 0.3  0.3 - 1.2 mg/dL   GFR calc non Af Amer >90  >90 mL/min   GFR calc Af Amer >90  >90 mL/min  CBC     Status: None   Collection Time    09/29/13  2:37 PM      Result Value Ref Range   WBC 8.8  4.0 - 10.5 K/uL   RBC 4.70  3.87 - 5.11 MIL/uL   Hemoglobin 14.8  12.0 - 15.0 g/dL   HCT 04.543.0  40.936.0 - 81.146.0 %   MCV 91.5  78.0 - 100.0 fL   MCH 31.5  26.0 - 34.0 pg   MCHC 34.4  30.0 - 36.0 g/dL   RDW 91.412.5  78.211.5 - 95.615.5 %   Platelets 189  150 - 400 K/uL  WET PREP, GENITAL     Status: Abnormal   Collection Time    09/29/13  2:50 PM      Result Value Ref Range   Yeast Wet Prep HPF POC NONE SEEN  NONE SEEN   Trich, Wet Prep NONE SEEN  NONE SEEN   Clue Cells Wet Prep HPF POC MODERATE (*) NONE SEEN   WBC, Wet Prep HPF POC FEW (*) NONE SEEN    IMAGING [place] Transvaginal Non-ob  09/29/2013   CLINICAL DATA:  New onset menorrhagia.  Bilateral pelvic pain.  EXAM: TRANSABDOMINAL AND TRANSVAGINAL ULTRASOUND OF PELVIS  TECHNIQUE: Both transabdominal and transvaginal ultrasound examinations of the pelvis were performed. Transabdominal technique was performed for global imaging of the pelvis including uterus, ovaries, adnexal regions, and pelvic cul-de-sac. It was necessary to proceed with endovaginal exam following the transabdominal exam to visualize the endometrium and ovaries to better advantage.  COMPARISON:  None  FINDINGS: Uterus  Measurements: 9.2 cm x 4.2 cm x 5 cm. No fibroids or other mass visualized.  Endometrium  Thickness: 3.8 mm.   No focal abnormality visualized.  Right ovary  Measurements: 35 mm x 22 mm x 18 mm. Normal appearance/no adnexal mass.  Left ovary  Measurements: 31 mm x 24 mm x 18 mm. Normal appearance/no adnexal mass.  Other findings  No free fluid.  IMPRESSION: Normal transabdominal and endovaginal pelvic ultrasound.   Electronically Signed   By: Amie Portlandavid  Ormond M.D.   On: 09/29/2013 15:51   [place] Pelvis Complete  09/29/2013   CLINICAL DATA:  New onset menorrhagia.  Bilateral pelvic pain.  EXAM: TRANSABDOMINAL AND TRANSVAGINAL ULTRASOUND OF PELVIS  TECHNIQUE: Both transabdominal and transvaginal ultrasound examinations of the pelvis were performed. Transabdominal technique was performed for global imaging of the pelvis including uterus, ovaries, adnexal regions, and pelvic cul-de-sac. It was necessary to proceed with endovaginal exam following the transabdominal exam to visualize the endometrium and ovaries to better advantage.  COMPARISON:  None  FINDINGS: Uterus  Measurements: 9.2 cm x 4.2 cm x 5 cm. No fibroids or other mass visualized.  Endometrium  Thickness: 3.8 mm.  No focal abnormality visualized.  Right ovary  Measurements: 35 mm x 22 mm x 18 mm. Normal appearance/no adnexal mass.  Left ovary  Measurements: 31 mm x 24 mm x 18 mm. Normal appearance/no adnexal mass.  Other findings  No free fluid.  IMPRESSION: Normal transabdominal and endovaginal pelvic ultrasound.   Electronically Signed   By: Amie Portlandavid  Ormond M.D.   On: 09/29/2013 15:51   MAU COURSE Pain resolved with ibuprofen.  Minimal bleeding while in maternity admissions.  ASSESSMENT 1. Menorrhagia with regular cycle   2. BV (bacterial vaginosis)   3. Dysmenorrhea    PLAN Discharge home in stable condition. GC/CT, urine cultures pending. Follow-up Information   Follow up with [facility]. (Will call to schedule followup appointment.)    Contact information:   [address] [place] [id number] [phone]       [facility] [place] [facility]. (As needed in emergencies)    Contact information:   [address] [id number] [place] [id number] [phone]        Medication List         ibuprofen 600 MG tablet  Commonly known as:  ADVIL,MOTRIN  Take 1 tablet (600 mg total) by mouth every 6 (six) hours as needed for cramping.     metroNIDAZOLE 500 MG tablet  Commonly known as:  FLAGYL  Take 1 tablet (500 mg total) by mouth 2 (two) times daily.     norgestimate-ethinyl estradiol 0.25-35 MG-MCG tablet  Commonly known as:  ORTHO-CYCLEN,SPRINTEC,PREVIFEM  Take 1 tablet by mouth daily.       a take 2 pills per day x3 days as needed to stop heavy vaginal bleeding. Then continue the rest pills as usual.   Dorathy KinsmanVirginia Prentiss Polio, CNM 09/29/2013  2:56 PM

## 2013-09-30 LAB — GC/CHLAMYDIA PROBE AMP
CT Probe RNA: NEGATIVE
GC PROBE AMP APTIMA: NEGATIVE

## 2013-11-11 ENCOUNTER — Telehealth: Payer: Self-pay | Admitting: *Deleted

## 2013-11-11 ENCOUNTER — Encounter: Payer: Medicaid Other | Admitting: Family Medicine

## 2013-11-11 NOTE — Telephone Encounter (Signed)
Attempted to call patient, no answer, left message for patient to call clinic and reschedule appointment.  

## 2013-11-28 ENCOUNTER — Encounter (HOSPITAL_COMMUNITY): Payer: Self-pay | Admitting: Emergency Medicine

## 2013-11-28 ENCOUNTER — Emergency Department (HOSPITAL_COMMUNITY)
Admission: EM | Admit: 2013-11-28 | Discharge: 2013-11-28 | Disposition: A | Discharge status: Home / Self Care | Payer: Self-pay | Source: Home / Self Care | Attending: Family Medicine | Admitting: Family Medicine

## 2013-11-28 DIAGNOSIS — M79675 Pain in left toe(s): Secondary | ICD-10-CM

## 2013-11-28 DIAGNOSIS — M79609 Pain in unspecified limb: Secondary | ICD-10-CM

## 2013-11-28 DIAGNOSIS — L089 Local infection of the skin and subcutaneous tissue, unspecified: Secondary | ICD-10-CM

## 2013-11-28 MED ORDER — SULFAMETHOXAZOLE-TMP DS 800-160 MG PO TABS: 1.0000 | ORAL_TABLET | Freq: Two times a day (BID) | ORAL | Status: DC

## 2013-11-28 NOTE — Discharge Instructions (Signed)
Wound Infection °A wound infection happens when a type of germ (bacteria) starts growing in the wound. In some cases, this can cause the wound to break open. If cared for properly, the infected wound will heal from the inside to the outside. Wound infections need treatment. °CAUSES °An infection is caused by bacteria growing in the wound.  °SYMPTOMS  °· Increase in redness, swelling, or pain at the wound site. °· Increase in drainage at the wound site. °· Wound or bandage (dressing) starts to smell bad. °· Fever. °· Feeling tired or fatigued. °· Pus draining from the wound. °TREATMENT  °Your health care provider will prescribe antibiotic medicine. The wound infection should improve within 24 to 48 hours. Any redness around the wound should stop spreading and the wound should be less painful.  °HOME CARE INSTRUCTIONS  °· Only take over-the-counter or prescription medicines for pain, discomfort, or fever as directed by your health care provider. °· Take your antibiotics as directed. Finish them even if you start to feel better. °· Gently wash the area with mild soap and water 2 times a day, or as directed. Rinse off the soap. Pat the area dry with a clean towel. Do not rub the wound. This may cause bleeding. °· Follow your health care provider's instructions for how often you need to change the dressing. °· Apply ointment and a dressing to the wound as directed. °· If the dressing sticks, moisten it with soapy water and gently remove it. °· Change the bandage right away if it becomes wet, dirty, or develops a bad smell. °· Take showers. Do not take tub baths, swim, or do anything that may soak the wound until it is healed. °· Avoid exercises that make you sweat heavily. °· Use anti-itch medicine as directed by your health care provider. The wound may itch when it is healing. Do not pick or scratch at the wound. °· Follow up with your health care provider to get your wound rechecked as directed. °SEEK MEDICAL CARE  IF: °· You have an increase in swelling, pain, or redness around the wound. °· You have an increase in the amount of pus coming from the wound. °· There is a bad smell coming from the wound. °· More of the wound breaks open. °· You have a fever. °MAKE SURE YOU:  °· Understand these instructions. °· Will watch your condition. °· Will get help right away if you are not doing well or get worse. °Document Released: 02/12/2003 Document Revised: 05/21/2013 Document Reviewed: 09/19/2010 °ExitCare® Patient Information ©2015 [employer]. This information is not intended to replace advice given to you by your health care provider. Make sure you discuss any questions you have with your health care provider. ° °

## 2013-11-28 NOTE — ED Provider Notes (Signed)
CSN: [id number]     Arrival date & time 11/28/13  0901 History   First MD Initiated Contact with Patient 11/28/13 [phone]     Chief Complaint  Patient presents with  . Toe Injury   (Consider location/radiation/quality/duration/timing/severity/associated sxs/prior Treatment) Patient is a 28 y.o. female presenting with toe pain. The history is provided by the patient. No language interpreter was used.  Toe Pain This is a new problem. The current episode started more than 1 week ago. The problem occurs constantly. The problem has not changed since onset.Nothing aggravates the symptoms. Nothing relieves the symptoms. She has tried nothing for the symptoms.    Past Medical History  Diagnosis Date  . Anemia   . H/O varicella   . Migraine   . Irregular periods/menstrual cycles   . Abnormal Pap smear 2012  . Low iron    Past Surgical History  Procedure Laterality Date  . Dilation and curettage of uterus  june 2012   Family History  Problem Relation Age of Onset  . Breast cancer Maternal Grandmother   . Cancer Maternal Grandmother     OVARIAN   History  Substance Use Topics  . Smoking status: Current Every Day Smoker -- 0.50 packs/day for 5 years    Types: Cigarettes  . Smokeless tobacco: Never Used  . Alcohol Use: 0.0 oz/week    0 drink(s) per week     Comment: occassionally   OB History   Grav Para Term Preterm Abortions TAB SAB Ect Mult Living   3 1 1  0 2 1 1  0 0 1     Review of Systems  All other systems reviewed and are negative.   Allergies  Review of patient's allergies indicates no known allergies.  Home Medications   Prior to Admission medications   Medication Sig Start Date End Date Taking? Authorizing Provider  ibuprofen (ADVIL,MOTRIN) 600 MG tablet Take 1 tablet (600 mg total) by mouth every 6 (six) hours as needed for cramping. 09/29/13   Dorathy KinsmanVirginia Smith, CNM  metroNIDAZOLE (FLAGYL) 500 MG tablet Take 1 tablet (500 mg total) by mouth 2 (two) times daily. 09/29/13    Dorathy KinsmanVirginia Smith, CNM  norgestimate-ethinyl estradiol (ORTHO-CYCLEN,SPRINTEC,PREVIFEM) 0.25-35 MG-MCG tablet Take 1 tablet by mouth daily. 09/29/13   Dorathy KinsmanVirginia Smith, CNM  sulfamethoxazole-trimethoprim (BACTRIM DS) 800-160 MG per tablet Take 1 tablet by mouth 2 (two) times daily. 11/28/13   Elson AreasLeslie K Sofia, PA-C   BP 132/79  Pulse 88  Temp(Src) 99.1 F (37.3 C) (Oral)  Resp 16  SpO2 100%  LMP 11/28/2013 Physical Exam  Nursing note and vitals reviewed. Constitutional: She is oriented to person, place, and time. She appears well-developed and well-nourished.  Musculoskeletal: She exhibits tenderness.  Swollen left 5th toe,  Corner of nail discolored yellow  Neurological: She is alert and oriented to person, place, and time.  Skin: There is erythema.  Psychiatric: She has a normal mood and affect.    ED Course  Procedures (including critical care time) Labs Review Labs Reviewed - No data to display  Imaging Review No results found.   MDM   1. Skin infection   2. Toe pain, left    Bactrim rx Soak area 20 minutes 4 times a day.   Return for recheck in 3 days    Elson AreasLeslie K Sofia, PA-C 11/28/13 1225  Lonia SkinnerLeslie K WaterfordSofia, [place] JerseyPA-C 11/28/13 1229

## 2013-11-28 NOTE — ED Notes (Signed)
Pt  Has  Tender  Red  And  Yellow  Area  To  l  Small  Toe   That  Began  sev  Months  Ago    Worse  Over  Last  sev  Days

## 2013-11-29 NOTE — ED Provider Notes (Signed)
Medical screening examination/treatment/procedure(s) were performed by a resident physician or non-physician practitioner and as the supervising physician I was immediately available for consultation/collaboration.  Evan Corey, MD    Evan S Corey, MD 11/29/13 1717 

## 2013-12-02 ENCOUNTER — Encounter (HOSPITAL_COMMUNITY): Payer: Self-pay | Admitting: Emergency Medicine

## 2013-12-02 ENCOUNTER — Emergency Department (INDEPENDENT_AMBULATORY_CARE_PROVIDER_SITE_OTHER)
Admission: EM | Admit: 2013-12-02 | Discharge: 2013-12-02 | Disposition: A | Discharge status: Home / Self Care | Payer: Self-pay | Source: Home / Self Care | Attending: Emergency Medicine | Admitting: Emergency Medicine

## 2013-12-02 DIAGNOSIS — L03119 Cellulitis of unspecified part of limb: Secondary | ICD-10-CM

## 2013-12-02 DIAGNOSIS — L03116 Cellulitis of left lower limb: Secondary | ICD-10-CM

## 2013-12-02 DIAGNOSIS — L02619 Cutaneous abscess of unspecified foot: Secondary | ICD-10-CM

## 2013-12-02 NOTE — Discharge Instructions (Signed)
Cellulitis Cellulitis is an infection of the skin and the tissue beneath it. The infected area is usually red and tender. Cellulitis occurs most often in the arms and lower legs.  CAUSES  Cellulitis is caused by bacteria that enter the skin through cracks or cuts in the skin. The most common types of bacteria that cause cellulitis are Staphylococcus and Streptococcus. SYMPTOMS   Redness and warmth.  Swelling.  Tenderness or pain.  Fever. DIAGNOSIS  Your caregiver can usually determine what is wrong based on a physical exam. Blood tests may also be done. TREATMENT  Treatment usually involves taking an antibiotic medicine. HOME CARE INSTRUCTIONS   Take your antibiotics as directed. Finish them even if you start to feel better.  Keep the infected arm or leg elevated to reduce swelling.  Apply a warm cloth to the affected area up to 4 times per day to relieve pain.  Only take over-the-counter or prescription medicines for pain, discomfort, or fever as directed by your caregiver.  Keep all follow-up appointments as directed by your caregiver. SEEK MEDICAL CARE IF:   You notice red streaks coming from the infected area.  Your red area gets larger or turns dark in color.  Your bone or joint underneath the infected area becomes painful after the skin has healed.  Your infection returns in the same area or another area.  You notice a swollen bump in the infected area.  You develop new symptoms. SEEK IMMEDIATE MEDICAL CARE IF:   You have a fever.  You feel very sleepy.  You develop vomiting or diarrhea.  You have a general ill feeling (malaise) with muscle aches and pains. MAKE SURE YOU:   Understand these instructions.  Will watch your condition.  Will get help right away if you are not doing well or get worse. Document Released: 02/23/2005 Document Revised: 11/15/2011 Document Reviewed: 08/01/2011 ExitCare Patient Information 2015 [employer]. This information is  not intended to replace advice given to you by your health care provider. Make sure you discuss any questions you have with your health care provider.  

## 2013-12-02 NOTE — ED Notes (Signed)
Pt  Reports  She  Was    Seen   approx  4  Days  Ago  For  Toe  Infection       SHe  Is  Here  Today  For  A  Recheck     She   States  She  Is  Doing better

## 2013-12-02 NOTE — ED Provider Notes (Signed)
Medical screening examination/treatment/procedure(s) were performed by non-physician practitioner and as supervising physician I was immediately available for consultation/collaboration.  Lyric Hoar, M.D.  Donnabelle Blanchard C Sherion Dooly, MD 12/02/13 0952 

## 2013-12-02 NOTE — ED Provider Notes (Signed)
CSN: [id number]     Arrival date & time 12/02/13  0814 History   First MD Initiated Contact with Patient 12/02/13 [phone]     Chief Complaint  Patient presents with  . Wound Check   (Consider location/radiation/quality/duration/timing/severity/associated sxs/prior Treatment) HPI Comments: Patient seen and treated for left foot cellulitis at 4th and 5th toes on 11-28-2013 and is now returning for wound recheck as advised. Reports significant improvement with warm soaks and Bactrim DS as prescribed. No additional issues.   Patient is a 28 y.o. female presenting with wound check. The history is provided by the patient.  Wound Check    Past Medical History  Diagnosis Date  . Anemia   . H/O varicella   . Migraine   . Irregular periods/menstrual cycles   . Abnormal Pap smear 2012  . Low iron    Past Surgical History  Procedure Laterality Date  . Dilation and curettage of uterus  june 2012   Family History  Problem Relation Age of Onset  . Breast cancer Maternal Grandmother   . Cancer Maternal Grandmother     OVARIAN   History  Substance Use Topics  . Smoking status: Current Every Day Smoker -- 0.50 packs/day for 5 years    Types: Cigarettes  . Smokeless tobacco: Never Used  . Alcohol Use: 0.0 oz/week    0 drink(s) per week     Comment: occassionally   OB History   Grav Para Term Preterm Abortions TAB SAB Ect Mult Living   3 1 1  0 2 1 1  0 0 1     Review of Systems  All other systems reviewed and are negative.   Allergies  Review of patient's allergies indicates no known allergies.  Home Medications   Prior to Admission medications   Medication Sig Start Date End Date Taking? Authorizing Provider  ibuprofen (ADVIL,MOTRIN) 600 MG tablet Take 1 tablet (600 mg total) by mouth every 6 (six) hours as needed for cramping. 09/29/13   Dorathy KinsmanVirginia Smith, CNM  metroNIDAZOLE (FLAGYL) 500 MG tablet Take 1 tablet (500 mg total) by mouth 2 (two) times daily. 09/29/13   Dorathy KinsmanVirginia Smith, CNM   norgestimate-ethinyl estradiol (ORTHO-CYCLEN,SPRINTEC,PREVIFEM) 0.25-35 MG-MCG tablet Take 1 tablet by mouth daily. 09/29/13   Dorathy KinsmanVirginia Smith, CNM  sulfamethoxazole-trimethoprim (BACTRIM DS) 800-160 MG per tablet Take 1 tablet by mouth 2 (two) times daily. 11/28/13   Elson AreasLeslie K Sofia, PA-C   BP 117/73  Temp(Src) 97.6 F (36.4 C) (Oral)  Resp 16  SpO2 99%  LMP 11/28/2013 Physical Exam  Nursing note and vitals reviewed. Constitutional: She is oriented to person, place, and time. She appears well-developed and well-nourished. No distress.  HENT:  Head: Normocephalic and atraumatic.  Eyes: Conjunctivae are normal. No scleral icterus.  Cardiovascular: Normal rate.   Pulmonary/Chest: Effort normal.  Musculoskeletal: Normal range of motion.  Neurological: She is alert and oriented to person, place, and time.  Skin: Skin is warm and dry.  +resolving cellulitis with minor areas of desquamation at left 4th and 5th toes.   Psychiatric: She has a normal mood and affect. Her behavior is normal.    ED Course  Procedures (including critical care time) Labs Review Labs Reviewed - No data to display  Imaging Review No results found.   MDM   1. Cellulitis of right foot   continue medication, skin care and warm soaks at home as directed. Return prn.   Ardis RowanJennifer Lee Trenna Kiely, [place]PA 12/02/13 [phone]

## 2013-12-24 ENCOUNTER — Inpatient Hospital Stay (HOSPITAL_COMMUNITY)
Admission: AD | Admit: 2013-12-24 | Discharge: 2013-12-25 | Disposition: A | Discharge status: Home / Self Care | Payer: Self-pay | Source: Ambulatory Visit | Attending: Obstetrics & Gynecology | Admitting: Obstetrics & Gynecology

## 2013-12-24 DIAGNOSIS — D649 Anemia, unspecified: Secondary | ICD-10-CM | POA: Insufficient documentation

## 2013-12-24 DIAGNOSIS — Z87891 Personal history of nicotine dependence: Secondary | ICD-10-CM | POA: Insufficient documentation

## 2013-12-24 DIAGNOSIS — O99019 Anemia complicating pregnancy, unspecified trimester: Secondary | ICD-10-CM | POA: Insufficient documentation

## 2013-12-24 DIAGNOSIS — O200 Threatened abortion: Secondary | ICD-10-CM | POA: Insufficient documentation

## 2013-12-24 LAB — CBC
HEMATOCRIT: 39.9 % (ref 36.0–46.0)
HEMOGLOBIN: 13.7 g/dL (ref 12.0–15.0)
MCH: 31.1 pg (ref 26.0–34.0)
MCHC: 34.3 g/dL (ref 30.0–36.0)
MCV: 90.7 fL (ref 78.0–100.0)
Platelets: 163 10*3/uL (ref 150–400)
RBC: 4.4 MIL/uL (ref 3.87–5.11)
RDW: 12.5 % (ref 11.5–15.5)
WBC: 8.9 10*3/uL (ref 4.0–10.5)

## 2013-12-24 LAB — URINALYSIS, ROUTINE W REFLEX MICROSCOPIC
Bilirubin Urine: NEGATIVE
GLUCOSE, UA: NEGATIVE mg/dL
Ketones, ur: NEGATIVE mg/dL
LEUKOCYTES UA: NEGATIVE
NITRITE: NEGATIVE
PH: 6 (ref 5.0–8.0)
Protein, ur: NEGATIVE mg/dL
SPECIFIC GRAVITY, URINE: 1.025 (ref 1.005–1.030)
Urobilinogen, UA: 0.2 mg/dL (ref 0.0–1.0)

## 2013-12-24 LAB — URINE MICROSCOPIC-ADD ON: WBC UA: NONE SEEN WBC/hpf (ref <3)

## 2013-12-24 LAB — POCT PREGNANCY, URINE: PREG TEST UR: POSITIVE — AB

## 2013-12-24 LAB — HCG, QUANTITATIVE, PREGNANCY: HCG, BETA CHAIN, QUANT, S: 352 m[IU]/mL — AB (ref <5)

## 2013-12-24 NOTE — MAU Note (Signed)
Pt reports blood on tissue this am, tonight bleeding is heavier. No pain. LMP 10/21/2013, positive home preg test x 3

## 2013-12-25 ENCOUNTER — Inpatient Hospital Stay (HOSPITAL_COMMUNITY): Payer: Self-pay

## 2013-12-25 ENCOUNTER — Encounter (HOSPITAL_COMMUNITY): Payer: Self-pay | Admitting: *Deleted

## 2013-12-25 DIAGNOSIS — O200 Threatened abortion: Secondary | ICD-10-CM

## 2013-12-25 LAB — WET PREP, GENITAL
Trich, Wet Prep: NONE SEEN
Yeast Wet Prep HPF POC: NONE SEEN

## 2013-12-25 NOTE — MAU Note (Signed)
PT  SAYS SHE HAD LMP ON 6-26-  ONLY LASTED X1 DAY-  SAYS REAL CYCLE WAS IN MAY  25TH.     SHE DID 3 HPT LAST WEEK-  ALL POSITIVE.   SAYS THIS AM  WHEN SHE WIPED - SAW SPOT OF BLOOD ON PAPER.    BUT TONIGHT AT 850PM-  AT WORK- SHE WENT TO B-ROOM- SAW BLOOD IN UNDERWEAR-  AND ALSO WHEN SHE WIPED.    NO  BIRTH CONTROL.   LAST SEX-  LAST NIGHT.

## 2013-12-25 NOTE — Discharge Instructions (Signed)

## 2013-12-25 NOTE — MAU Provider Note (Signed)
History     CSN: [id number]  Arrival date and time: 12/24/13 2119   First Provider Initiated Contact with Patient 12/25/13 0036      Chief Complaint  Patient presents with  . Possible Pregnancy   Possible Pregnancy    Teresa Farley is a 28 y.o. [phone] at Unknown who presents today with vaginal bleeding. She states that her last normal period was 10/21/13, and that she also had a period at the end of June. However, that was shorter and lighter than usual. She does have some mild lower abdominal cramping.   Past Medical History  Diagnosis Date  . Anemia   . H/O varicella   . Migraine   . Irregular periods/menstrual cycles   . Abnormal Pap smear 2012  . Low iron     Past Surgical History  Procedure Laterality Date  . Dilation and curettage of uterus  june 2012    Family History  Problem Relation Age of Onset  . Breast cancer Maternal Grandmother   . Cancer Maternal Grandmother     OVARIAN    History  Substance Use Topics  . Smoking status: Current Every Day Smoker -- 0.50 packs/day for 5 years    Types: Cigarettes  . Smokeless tobacco: Never Used  . Alcohol Use: 0.0 oz/week    0 drink(s) per week     Comment: occassionally  LAST TIME USED- JULY  1 ST    Allergies: No Known Allergies  Prescriptions prior to admission  Medication Sig Dispense Refill  . ibuprofen (ADVIL,MOTRIN) 600 MG tablet Take 1 tablet (600 mg total) by mouth every 6 (six) hours as needed for cramping.  30 tablet  1  . sulfamethoxazole-trimethoprim (BACTRIM DS) 800-160 MG per tablet Take 1 tablet by mouth 2 (two) times daily.  14 tablet  0  . metroNIDAZOLE (FLAGYL) 500 MG tablet Take 1 tablet (500 mg total) by mouth 2 (two) times daily.  14 tablet  0  . norgestimate-ethinyl estradiol (ORTHO-CYCLEN,SPRINTEC,PREVIFEM) 0.25-35 MG-MCG tablet Take 1 tablet by mouth daily.  1 Package  2    ROS Physical Exam   Blood pressure 124/66, pulse 80, temperature 98.6 F (37 C), temperature source  Oral, resp. rate 16, height 5\' 7"  (1.702 m), weight 87.998 kg (194 lb), last menstrual period 10/21/2013, SpO2 98.00%.  Physical Exam  Nursing note and vitals reviewed. Constitutional: She is oriented to person, place, and time. She appears well-developed and well-nourished. No distress.  Cardiovascular: Normal rate.   Respiratory: Effort normal.  GI: Soft. There is no tenderness. There is no rebound.  Genitourinary:   External: no lesion Vagina: small amount of blood seen  Cervix: pink, smooth, no CMT Uterus: NSSC Adnexa: NT   Neurological: She is alert and oriented to person, place, and time.  Skin: Skin is warm and dry.  Psychiatric: She has a normal mood and affect.    MAU Course  Procedures Results for orders placed during the hospital encounter of 12/24/13 (from the past 24 hour(s))  URINALYSIS, ROUTINE W REFLEX MICROSCOPIC     Status: Abnormal   Collection Time    12/24/13  9:25 PM      Result Value Ref Range   Color, Urine YELLOW  YELLOW   APPearance CLEAR  CLEAR   Specific Gravity, Urine 1.025  1.005 - 1.030   pH 6.0  5.0 - 8.0   Glucose, UA NEGATIVE  NEGATIVE mg/dL   Hgb urine dipstick LARGE (*) NEGATIVE   Bilirubin Urine NEGATIVE  NEGATIVE   Ketones, ur NEGATIVE  NEGATIVE mg/dL   Protein, ur NEGATIVE  NEGATIVE mg/dL   Urobilinogen, UA 0.2  0.0 - 1.0 mg/dL   Nitrite NEGATIVE  NEGATIVE   Leukocytes, UA NEGATIVE  NEGATIVE  URINE MICROSCOPIC-ADD ON     Status: Abnormal   Collection Time    12/24/13  9:25 PM      Result Value Ref Range   Squamous Epithelial / LPF RARE  RARE   WBC, UA    <3 WBC/hpf   Value: NO FORMED ELEMENTS SEEN ON URINE MICROSCOPIC EXAMINATION   RBC / HPF 7-10  <3 RBC/hpf   Bacteria, UA FEW (*) RARE  POCT PREGNANCY, URINE     Status: Abnormal   Collection Time    12/24/13 10:00 PM      Result Value Ref Range   Preg Test, Ur POSITIVE (*) NEGATIVE  CBC     Status: None   Collection Time    12/24/13 10:03 PM      Result Value Ref Range    WBC 8.9  4.0 - 10.5 K/uL   RBC 4.40  3.87 - 5.11 MIL/uL   Hemoglobin 13.7  12.0 - 15.0 g/dL   HCT 81.139.9  91.436.0 - 78.246.0 %   MCV 90.7  78.0 - 100.0 fL   MCH 31.1  26.0 - 34.0 pg   MCHC 34.3  30.0 - 36.0 g/dL   RDW 95.612.5  21.311.5 - 08.615.5 %   Platelets 163  150 - 400 K/uL  HCG, QUANTITATIVE, PREGNANCY     Status: Abnormal   Collection Time    12/24/13 10:03 PM      Result Value Ref Range   hCG, Beta Chain, Quant, S 352 (*) <5 mIU/mL  WET PREP, GENITAL     Status: Abnormal   Collection Time    12/25/13 12:45 AM      Result Value Ref Range   Yeast Wet Prep HPF POC NONE SEEN  NONE SEEN   Trich, Wet Prep NONE SEEN  NONE SEEN   Clue Cells Wet Prep HPF POC FEW (*) NONE SEEN   WBC, Wet Prep HPF POC FEW (*) NONE SEEN   [place] Ob Comp Less 14 Wks  12/25/2013   CLINICAL DATA:  Early pregnancy. Bleeding. Estimated gestational age by LMP is 9 weeks 2 days. Quantitative beta HCG is 352. Patient is head home pregnancy tests positive x3.  EXAM: OBSTETRIC <14 WK ULTRASOUND  TECHNIQUE: Transabdominal ultrasound was performed for evaluation of the gestation as well as the maternal uterus and adnexal regions.  COMPARISON:  None.  FINDINGS: Intrauterine gestational sac: A small circumscribed fluid collection is demonstrated in the endometrium consistent with an early intrauterine gestational sac.  Yolk sac:  Not identified.  Embryo:  Not identified.  Cardiac Activity: Not identified.  MSD: 4.4  mm   5 w   0  d                  US EDC: 08/27/2014  Maternal uterus/adnexae: Uterus is anteverted. No myometrial mass lesions. No subchorionic hemorrhage. Both ovaries are identified and appear normal. No free pelvic fluid collections.  IMPRESSION: Probable early intrauterine gestational sac, but no yolk sac, fetal pole, or cardiac activity yet visualized. Recommend follow-up quantitative B-HCG levels and follow-up US in 14 days to confirm and assess viability. This recommendation follows SRU consensus guidelines: Diagnostic Criteria  for Nonviable Pregnancy Early in the First Trimester. Malva Limes Engl J Med 2013; 578:4696-29; 369:1443-51.  Electronically Signed   By: Burman Nieves M.D.   On: 12/25/2013 03:17   US Ob Transvaginal  12/25/2013   CLINICAL DATA:  Early pregnancy. Bleeding. Estimated gestational age by LMP is 9 weeks 2 days. Quantitative beta HCG is 352. Patient is head home pregnancy tests positive x3.  EXAM: OBSTETRIC <14 WK ULTRASOUND  TECHNIQUE: Transabdominal ultrasound was performed for evaluation of the gestation as well as the maternal uterus and adnexal regions.  COMPARISON:  None.  FINDINGS: Intrauterine gestational sac: A small circumscribed fluid collection is demonstrated in the endometrium consistent with an early intrauterine gestational sac.  Yolk sac:  Not identified.  Embryo:  Not identified.  Cardiac Activity: Not identified.  MSD: 4.4  mm   5 w   0  d                  [place] EDC: 08/27/2014  Maternal uterus/adnexae: Uterus is anteverted. No myometrial mass lesions. No subchorionic hemorrhage. Both ovaries are identified and appear normal. No free pelvic fluid collections.  IMPRESSION: Probable early intrauterine gestational sac, but no yolk sac, fetal pole, or cardiac activity yet visualized. Recommend follow-up quantitative B-HCG levels and follow-up US in 14 days to confirm and assess viability. This recommendation follows SRU consensus guidelines: Diagnostic Criteria for Nonviable Pregnancy Early in the First Trimester. Malva Limes Med 2013; 191:4782-95.   Electronically Signed   By: Burman Nieves M.D.   On: 12/25/2013 03:17     Assessment and Plan   1. Threatened abortion in first trimester    [place] and HCG more consistent with a LMP at the end of June Bleeding precautions Ectopic precautions reviewed Return to MAU as needed  Follow-up Information   Follow up with [facility]. (FRIDAY JULY 31ST BETWEEN 8-11AM)    Specialty:  Obstetrics and Gynecology   Contact information:   [address] [place] [address] [phone]       Tawnya Crook 12/25/2013, 12:44 AM

## 2013-12-26 LAB — GC/CHLAMYDIA PROBE AMP
CT Probe RNA: NEGATIVE
GC PROBE AMP APTIMA: NEGATIVE

## 2013-12-27 ENCOUNTER — Telehealth: Payer: Self-pay | Admitting: Obstetrics and Gynecology

## 2013-12-27 ENCOUNTER — Other Ambulatory Visit: Payer: Self-pay

## 2013-12-27 DIAGNOSIS — N939 Abnormal uterine and vaginal bleeding, unspecified: Secondary | ICD-10-CM

## 2013-12-27 LAB — HCG, QUANTITATIVE, PREGNANCY: hCG, Beta Chain, Quant, S: 188.3 m[IU]/mL

## 2013-12-27 NOTE — Telephone Encounter (Signed)
[facility] called to give STAT result of HCG level that was done today 12/27/13. Result given to Dr. Debroah LoopArnold. Per Dr. Debroah LoopArnold- pt to come again for repeat HCG LAB Appt next week and appt for f/u from MAU in 2-3 weeks. ( LAB appt made on 01/03/14 @ 0900). Message routed to front desk to make 2-3 weeks gyn appt. Pt called, notified or appts; pt agrees.

## 2014-01-03 ENCOUNTER — Other Ambulatory Visit: Payer: Self-pay

## 2014-01-03 DIAGNOSIS — N939 Abnormal uterine and vaginal bleeding, unspecified: Secondary | ICD-10-CM

## 2014-01-03 LAB — HCG, QUANTITATIVE, PREGNANCY: HCG, BETA CHAIN, QUANT, S: 3.7 m[IU]/mL

## 2014-01-06 ENCOUNTER — Telehealth: Payer: Self-pay | Admitting: General Practice

## 2014-01-06 NOTE — Telephone Encounter (Signed)
Patient called back in to front office. Informed patient of results. Patient verbalized understanding and asked if she still needed to come in next month. Told patient yes so we can discuss birth control options if that's something she is interested in. Patient stated yes and had no other questions

## 2014-01-06 NOTE — Telephone Encounter (Signed)
Called patient, no answer- left message stating we are trying to reach you with some results, nothing urgent but please call us back at the clinics 

## 2014-01-06 NOTE — Telephone Encounter (Signed)
Message copied by Shraga Custard, CAKathee DeltonRIE L on Mon Jan 06, 2014  8:14 AM ------      Message from: Lesly DukesLEGGETT, KELLY H      Created: Sun Jan 05, 2014  9:57 AM       Beta Hcg <5 -- negative test.  Please call pt with results. ------

## 2014-01-24 ENCOUNTER — Encounter: Payer: Self-pay | Admitting: Obstetrics and Gynecology

## 2014-01-29 ENCOUNTER — Encounter: Payer: Self-pay | Admitting: Advanced Practice Midwife

## 2014-01-29 ENCOUNTER — Encounter: Payer: Self-pay | Admitting: Obstetrics & Gynecology

## 2014-02-15 ENCOUNTER — Encounter (HOSPITAL_COMMUNITY): Payer: Self-pay | Admitting: Emergency Medicine

## 2014-02-15 ENCOUNTER — Emergency Department (INDEPENDENT_AMBULATORY_CARE_PROVIDER_SITE_OTHER): Payer: Self-pay

## 2014-02-15 ENCOUNTER — Emergency Department (INDEPENDENT_AMBULATORY_CARE_PROVIDER_SITE_OTHER)
Admission: EM | Admit: 2014-02-15 | Discharge: 2014-02-15 | Disposition: A | Discharge status: Home / Self Care | Payer: Self-pay | Source: Home / Self Care | Attending: Family Medicine | Admitting: Family Medicine

## 2014-02-15 DIAGNOSIS — S93609A Unspecified sprain of unspecified foot, initial encounter: Secondary | ICD-10-CM

## 2014-02-15 DIAGNOSIS — S93601A Unspecified sprain of right foot, initial encounter: Secondary | ICD-10-CM

## 2014-02-15 NOTE — ED Provider Notes (Signed)
CSN: [id number]     Arrival date & time 02/15/14  1752 History   First MD Initiated Contact with Patient 02/15/14 1800     Chief Complaint  Patient presents with  . Foot Injury   (Consider location/radiation/quality/duration/timing/severity/associated sxs/prior Treatment) Patient is a 28 y.o. female presenting with foot injury. The history is provided by the patient.  Foot Injury Location:  Foot Time since incident:  2 days Injury: yes   Mechanism of injury comment:  Stumbled and twisted right lat foot. Foot location:  Dorsum of R foot Pain details:    Quality:  Sharp   Onset quality:  Sudden Chronicity:  New Dislocation: no   Prior injury to area:  No Associated symptoms: decreased ROM     Past Medical History  Diagnosis Date  . Anemia   . H/O varicella   . Migraine   . Irregular periods/menstrual cycles   . Abnormal Pap smear 2012  . Low iron    Past Surgical History  Procedure Laterality Date  . Dilation and curettage of uterus  june 2012   Family History  Problem Relation Age of Onset  . Breast cancer Maternal Grandmother   . Cancer Maternal Grandmother     OVARIAN   History  Substance Use Topics  . Smoking status: Current Every Day Smoker -- 0.50 packs/day for 5 years    Types: Cigarettes  . Smokeless tobacco: Never Used  . Alcohol Use: 0.0 oz/week    0 drink(s) per week     Comment: occassionally  LAST TIME USED- JULY  1 ST   OB History   Grav Para Term Preterm Abortions TAB SAB Ect Mult Living   0 0 0 1     Review of Systems  Musculoskeletal: Positive for gait problem. Negative for joint swelling.  Skin: Negative.     Allergies  Review of patient's allergies indicates no known allergies.  Home Medications   Prior to Admission medications   Medication Sig Start Date End Date Taking? Authorizing Provider  ibuprofen (ADVIL,MOTRIN) 600 MG tablet Take 1 tablet (600 mg total) by mouth every 6 (six) hours as needed for cramping. 09/29/13    Dorathy Kinsman, CNM  metroNIDAZOLE (FLAGYL) 500 MG tablet Take 1 tablet (500 mg total) by mouth 2 (two) times daily. 09/29/13   Dorathy Kinsman, CNM  norgestimate-ethinyl estradiol (ORTHO-CYCLEN,SPRINTEC,PREVIFEM) 0.25-35 MG-MCG tablet Take 1 tablet by mouth daily. 09/29/13   Dorathy Kinsman, CNM  sulfamethoxazole-trimethoprim (BACTRIM DS) 800-160 MG per tablet Take 1 tablet by mouth 2 (two) times daily. 11/28/13   Elson Areas, PA-C   BP 127/75  Pulse 75  Temp(Src) 98.5 F (36.9 C) (Oral)  Resp 16  SpO2 98%  LMP 02/15/2014 Physical Exam  Nursing note and vitals reviewed. Constitutional: She is oriented to person, place, and time. She appears well-developed and well-nourished.  Musculoskeletal: She exhibits tenderness.       Feet:  Neurological: She is alert and oriented to person, place, and time.  Skin: Skin is warm and dry.    ED Course  Procedures (including critical care time) Labs Review Labs Reviewed - No data to display  Imaging Review Dg Foot Complete Right  02/15/2014   CLINICAL DATA:  Injured right foot 2 days ago with persistent lateral foot pain. Initial encounter.  EXAM: RIGHT FOOT COMPLETE - 3+ VIEW  COMPARISON:  None.  FINDINGS: No evidence of acute fracture or dislocation. Joint spaces well preserved. Well-preserved bone mineral  density. No intrinsic osseous abnormalities. Lateral soft tissue swelling.  IMPRESSION: No osseous abnormality.   Electronically Signed   By: Hulan Saas M.D.   On: 02/15/2014 18:19     MDM   1. Foot sprain, right, initial encounter        Linna Hoff, MD 02/15/14 [phone]

## 2014-02-15 NOTE — ED Notes (Signed)
Reports she inverted right foot Thursday night getting into shower Swelling and painful w/activity; slow steady gait Alert, no signs of acute distress.

## 2014-02-15 NOTE — Discharge Instructions (Signed)
Wear ankle support as needed for comfort, activity as tolerated. advil or tylenol as needed, return  if further problems.

## 2014-03-28 ENCOUNTER — Encounter: Payer: Self-pay | Admitting: General Practice

## 2014-03-31 ENCOUNTER — Encounter (HOSPITAL_COMMUNITY): Payer: Self-pay | Admitting: Emergency Medicine

## 2014-08-18 ENCOUNTER — Other Ambulatory Visit: Payer: Self-pay | Admitting: Obstetrics and Gynecology

## 2014-10-28 ENCOUNTER — Emergency Department (INDEPENDENT_AMBULATORY_CARE_PROVIDER_SITE_OTHER): Payer: PRIVATE HEALTH INSURANCE

## 2014-10-28 ENCOUNTER — Emergency Department (HOSPITAL_COMMUNITY)
Admission: EM | Admit: 2014-10-28 | Discharge: 2014-10-28 | Disposition: A | Discharge status: Home / Self Care | Payer: PRIVATE HEALTH INSURANCE | Source: Home / Self Care | Attending: Family Medicine | Admitting: Family Medicine

## 2014-10-28 ENCOUNTER — Encounter (HOSPITAL_COMMUNITY): Payer: Self-pay | Admitting: Emergency Medicine

## 2014-10-28 DIAGNOSIS — S63619A Unspecified sprain of unspecified finger, initial encounter: Secondary | ICD-10-CM

## 2014-10-28 MED ORDER — DICLOFENAC POTASSIUM 50 MG PO TABS: 50.0000 mg | ORAL_TABLET | Freq: Three times a day (TID) | ORAL | Status: DC

## 2014-10-28 NOTE — Discharge Instructions (Signed)
Ice, medicine and keep fingers taped for comfort, see orthopedist if further problems.

## 2014-10-28 NOTE — ED Provider Notes (Signed)
CSN: [id number]     Arrival date & time 10/28/14  0845 History   First MD Initiated Contact with Patient 10/28/14 1034     Chief Complaint  Patient presents with  . Hand Pain   (Consider location/radiation/quality/duration/timing/severity/associated sxs/prior Treatment) Patient is a 29 y.o. female presenting with hand pain. The history is provided by the patient.  Hand Pain This is a new problem. The current episode started 12 to 24 hours ago (playing softball and right hand injuerd during activity). The problem has been gradually worsening.    Past Medical History  Diagnosis Date  . Anemia   . H/O varicella   . Migraine   . Irregular periods/menstrual cycles   . Abnormal Pap smear 2012  . Low iron    Past Surgical History  Procedure Laterality Date  . Dilation and curettage of uterus  june 2012   Family History  Problem Relation Age of Onset  . Breast cancer Maternal Grandmother   . Cancer Maternal Grandmother     OVARIAN   History  Substance Use Topics  . Smoking status: Current Every Day Smoker -- 0.50 packs/day for 5 years    Types: Cigarettes  . Smokeless tobacco: Never Used  . Alcohol Use: 0.0 oz/week    0 drink(s) per week     Comment: occassionally  LAST TIME USED- JULY  1 ST   OB History    Gravida Para Term Preterm AB TAB SAB Ectopic Multiple Living   3 1 1  0 2 1 1  0 0 1     Review of Systems  Constitutional: Negative.   Musculoskeletal: Positive for joint swelling. Negative for gait problem.  Skin: Negative.     Allergies  Review of patient's allergies indicates no known allergies.  Home Medications   Prior to Admission medications   Medication Sig Start Date End Date Taking? Authorizing Provider  diclofenac (CATAFLAM) 50 MG tablet Take 1 tablet (50 mg total) by mouth 3 (three) times daily. For pain 10/28/14   Linna HoffJames D Kindl, MD  ibuprofen (ADVIL,MOTRIN) 600 MG tablet Take 1 tablet (600 mg total) by mouth every 6 (six) hours as needed for cramping.  09/29/13   Dorathy KinsmanVirginia Smith, CNM  metroNIDAZOLE (FLAGYL) 500 MG tablet Take 1 tablet (500 mg total) by mouth 2 (two) times daily. 09/29/13   Dorathy KinsmanVirginia Smith, CNM  norgestimate-ethinyl estradiol (ORTHO-CYCLEN,SPRINTEC,PREVIFEM) 0.25-35 MG-MCG tablet Take 1 tablet by mouth daily. 09/29/13   Dorathy KinsmanVirginia Smith, CNM  sulfamethoxazole-trimethoprim (BACTRIM DS) 800-160 MG per tablet Take 1 tablet by mouth 2 (two) times daily. 11/28/13   Elson AreasLeslie K Sofia, PA-C   BP 103/61 mmHg  Pulse 79  Temp(Src) 98.2 F (36.8 C) (Oral)  Resp 12  SpO2 97%  LMP 09/27/2014 (LMP Unknown) Physical Exam  Constitutional: She is oriented to person, place, and time. She appears well-developed and well-nourished.  Musculoskeletal: She exhibits tenderness.       Hands: Neurological: She is alert and oriented to person, place, and time.  Skin: Skin is warm and dry.  Nursing note and vitals reviewed.   ED Course  Procedures (including critical care time) Labs Review Labs Reviewed - No data to display  Imaging Review Dg Hand Complete Right  10/28/2014   CLINICAL DATA:  Right hand pain after baseball injury yesterday. Initial encounter.  EXAM: RIGHT HAND - COMPLETE 3+ VIEW  COMPARISON:  None.  FINDINGS: There is no evidence of fracture or dislocation. There is no evidence of arthropathy or other focal bone abnormality.  Soft tissues are unremarkable.  IMPRESSION: Normal right hand.   Electronically Signed   By: Lupita Raider, M.D.   On: 10/28/2014 11:30   X-rays reviewed and report per radiologist.   MDM   1. Sprain of finger of right hand, initial encounter        Linna Hoff, MD 10/28/14 1750

## 2014-10-28 NOTE — ED Notes (Signed)
Reports possible injury to right hand yesterday while playing soft ball.   Pt is c/o pain and swelling in the fourth and fifth digit.  States "I can't move my pinky finger".    Pt has been using ice for comfort.

## 2015-02-20 LAB — OB RESULTS CONSOLE GC/CHLAMYDIA
CHLAMYDIA, DNA PROBE: NEGATIVE
Gonorrhea: NEGATIVE

## 2015-02-20 LAB — OB RESULTS CONSOLE ABO/RH: RH Type: POSITIVE

## 2015-02-20 LAB — OB RESULTS CONSOLE RUBELLA ANTIBODY, IGM: RUBELLA: IMMUNE

## 2015-02-20 LAB — OB RESULTS CONSOLE HEPATITIS B SURFACE ANTIGEN: HEP B S AG: NEGATIVE

## 2015-02-20 LAB — OB RESULTS CONSOLE RPR: RPR: NONREACTIVE

## 2015-02-20 LAB — OB RESULTS CONSOLE HIV ANTIBODY (ROUTINE TESTING): HIV: NONREACTIVE

## 2015-03-17 ENCOUNTER — Inpatient Hospital Stay (HOSPITAL_COMMUNITY)
Admission: AD | Admit: 2015-03-17 | Discharge: 2015-03-17 | Disposition: A | Discharge status: Home / Self Care | Payer: PRIVATE HEALTH INSURANCE | Source: Ambulatory Visit | Attending: Obstetrics and Gynecology | Admitting: Obstetrics and Gynecology

## 2015-03-17 ENCOUNTER — Encounter (HOSPITAL_COMMUNITY): Payer: Self-pay | Admitting: *Deleted

## 2015-03-17 DIAGNOSIS — Z3491 Encounter for supervision of normal pregnancy, unspecified, first trimester: Secondary | ICD-10-CM | POA: Insufficient documentation

## 2015-03-17 LAB — URINE MICROSCOPIC-ADD ON

## 2015-03-17 LAB — URINALYSIS, ROUTINE W REFLEX MICROSCOPIC
GLUCOSE, UA: NEGATIVE mg/dL
Ketones, ur: 15 mg/dL — AB
Leukocytes, UA: NEGATIVE
Nitrite: NEGATIVE
Protein, ur: NEGATIVE mg/dL
Specific Gravity, Urine: >1.03 — ABNORMAL HIGH (ref 1.005–1.030)
UROBILINOGEN UA: 4 mg/dL — AB (ref 0.0–1.0)
pH: 6 (ref 5.0–8.0)

## 2015-03-17 MED ORDER — ONDANSETRON HCL 4 MG/2ML IJ SOLN
4.0000 mg | Freq: Four times a day (QID) | INTRAMUSCULAR | Status: DC
  Administered 2015-03-17: 4 mg via INTRAVENOUS
  Filled 2015-03-17: qty 2

## 2015-03-17 MED ORDER — DEXTROSE-NACL 5-0.45 % IV SOLN: Freq: Once | INTRAVENOUS | Status: DC

## 2015-03-17 MED ORDER — M.V.I. ADULT IV INJ
INJECTION | Freq: Once | INTRAVENOUS | Status: AC
  Administered 2015-03-17: 16:00:00 via INTRAVENOUS
  Filled 2015-03-17: qty 1000

## 2015-03-17 MED ORDER — PROMETHAZINE HCL 12.5 MG PO TABS: 12.5000 mg | ORAL_TABLET | Freq: Four times a day (QID) | ORAL | Status: DC | PRN

## 2015-03-17 NOTE — MAU Note (Signed)
Feeling or is dehydrated.  Midnight last night things got bad,can't keep anything down.  Had been on zofran, ran out.

## 2015-03-17 NOTE — MAU Note (Signed)
Urine in lab 

## 2015-03-17 NOTE — Discharge Instructions (Signed)
Eating Plan for Hyperemesis Gravidarum °Severe cases of hyperemesis gravidarum can lead to dehydration and malnutrition. The hyperemesis eating plan is one way to lessen the symptoms of nausea and vomiting. It is often used with prescribed medicines to control your symptoms.  °WHAT CAN I DO TO RELIEVE MY SYMPTOMS? °Listen to your body. Everyone is different and has different preferences. Find what works best for you. Some of the following things may help: °· Eat and drink slowly. °· Eat 5-6 small meals daily instead of 3 large meals.   °· Eat crackers before you get out of bed in the morning.   °· Starchy foods are usually well tolerated (such as cereal, toast, bread, potatoes, pasta, rice, and pretzels).   °· Ginger may help with nausea. Add ¼ tsp ground ginger to hot tea or choose ginger tea.   °· Try drinking 100% fruit juice or an electrolyte drink. °· Continue to take your prenatal vitamins as directed by your health care provider. If you are having trouble taking your prenatal vitamins, talk with your health care provider about different options. °· Include at least 1 serving of protein with your meals and snacks (such as meats or poultry, beans, nuts, eggs, or yogurt). Try eating a protein-rich snack before bed (such as cheese and crackers or a half turkey or peanut butter sandwich). °WHAT THINGS SHOULD I AVOID TO REDUCE MY SYMPTOMS? °The following things may help reduce your symptoms: °· Avoid foods with strong smells. Try eating meals in well-ventilated areas that are free of odors. °· Avoid drinking water or other beverages with meals. Try not to drink anything less than 30 minutes before and after meals. °· Avoid drinking more than 1 cup of fluid at a time. °· Avoid fried or high-fat foods, such as butter and cream sauces. °· Avoid spicy foods. °· Avoid skipping meals the best you can. Nausea can be more intense on an empty stomach. If you cannot tolerate food at that time, do not force it. Try sucking on  ice chips or other frozen items and make up the calories later. °· Avoid lying down within 2 hours after eating. °  °This information is not intended to replace advice given to you by your health care provider. Make sure you discuss any questions you have with your health care provider. °  °Document Released: 03/13/2007 Document Revised: 05/21/2013 Document Reviewed: 03/20/2013 °Elsevier Interactive Patient Education ©2016 Elsevier Inc. ° °

## 2015-03-17 NOTE — Discharge Summary (Signed)
[facility]  Teresa Farley  DOB:    April 21, 1986 MRN:    [id number] CSN:    [id number]  Date of admission:                    03/17/2015  Date of discharge:  03/17/2015  Admission diagnosis:  [redacted] week gestation  Twins  Nausea and vomiting of pregnancy  Ketonuria   Discharge Diagnosis:  Same  Procedures this admission:  IV fluids and Maternity Admissions Unit  History of Present Illness:  Ms. Teresa Farley is a 29 y.o. female, [id number], who presents at 7817w1d weeks gestation. [facility]. She was evaluated for IV fluids due to nausea and vomiting of pregnancy and ketonuria. Her pregnancy has been complicated by: 20 [address] [place], nausea vomiting of pregnancy.  Hospital course:  The patient was observed for nausea and vomiting of pregnancy.   Her urinalysis showed ketonuria. The patient was given a liter of D5 LR with multivitamins. She was observed. She was given anti-emetics through her IV line. She felt better. She was discharged to home doing well.  Discharge Physical Exam:  BP 113/63 mmHg  Pulse 90  Temp(Src) 97.8 F (36.6 C) (Oral)  Resp 18  Wt 188 lb 3.2 oz (85.367 kg)  LMP 09/27/2014 (LMP Unknown)   General: alert and no distress Resp: clear to auscultation bilaterally Cardio: regular rate and rhythm GI: Soft and nontender  Exam deferred.   Laboratory values:  Blood type B positive Rubella immune HIV nonreactive RPR nonreactive Urine culture negative Gonorrhea negative Chlamydia negative  Discharge Information:  Activity:           unrestricted Diet:                routine Medications: Zofran 4 mg every 6 hours as needed for nausea                         Phenergan 12.5 mg every 6 hours as needed for nausea Condition:      improved Instructions:  Return for routine  office visit. Call if nausea and vomiting return. Discharge to: home    Hospital course:  The patient was given IV fluids and eyes be Zofran. She felt better. She was discharged home after tolerating regular food. She will follow-up in the office in 2 weeks or as needed.   Janine LimboSTRINGER,Quincey Quesinberry V 03/17/2015 17:14

## 2015-03-20 ENCOUNTER — Encounter (HOSPITAL_COMMUNITY): Payer: Self-pay | Admitting: Obstetrics and Gynecology

## 2015-03-20 ENCOUNTER — Inpatient Hospital Stay (HOSPITAL_COMMUNITY): Payer: PRIVATE HEALTH INSURANCE

## 2015-03-20 ENCOUNTER — Inpatient Hospital Stay (HOSPITAL_COMMUNITY)
Admission: AD | Admit: 2015-03-20 | Discharge: 2015-03-20 | Disposition: A | Discharge status: Home / Self Care | Payer: PRIVATE HEALTH INSURANCE | Source: Ambulatory Visit | Attending: Obstetrics and Gynecology | Admitting: Obstetrics and Gynecology

## 2015-03-20 DIAGNOSIS — IMO0001 Reserved for inherently not codable concepts without codable children: Secondary | ICD-10-CM

## 2015-03-20 DIAGNOSIS — Z3A13 13 weeks gestation of pregnancy: Secondary | ICD-10-CM | POA: Insufficient documentation

## 2015-03-20 DIAGNOSIS — O30001 Twin pregnancy, unspecified number of placenta and unspecified number of amniotic sacs, first trimester: Secondary | ICD-10-CM | POA: Insufficient documentation

## 2015-03-20 DIAGNOSIS — Z87891 Personal history of nicotine dependence: Secondary | ICD-10-CM | POA: Diagnosis not present

## 2015-03-20 DIAGNOSIS — O209 Hemorrhage in early pregnancy, unspecified: Secondary | ICD-10-CM

## 2015-03-20 DIAGNOSIS — O208 Other hemorrhage in early pregnancy: Secondary | ICD-10-CM | POA: Insufficient documentation

## 2015-03-20 DIAGNOSIS — O4690 Antepartum hemorrhage, unspecified, unspecified trimester: Secondary | ICD-10-CM | POA: Diagnosis present

## 2015-03-20 HISTORY — DX: Anogenital (venereal) warts: A63.0

## 2015-03-20 NOTE — MAU Note (Signed)
Urine in lab 

## 2015-03-20 NOTE — MAU Note (Signed)
Pt repots she passed a small blood clot this morning and had bleeding after that. Had some mild cramping but no pain now.

## 2015-03-20 NOTE — Discharge Instructions (Signed)

## 2015-03-20 NOTE — MAU Provider Note (Signed)
History   29 yo [id number] with known twin pregnancy presented unannounced c/o episode of BRB this am, with small clot passed.  No recent IC.  Small amount cramping on LLQ.  Has been struggling with nausea--seen in MAU 03/17/15 for N/V--previously on Zofran, now on Phenergan.  Able to eat and drink, but still has nausea.  Had NOB w/u 03/18/15.  Negative cultures 02/20/15.  Patient Active Problem List   Diagnosis Date Noted  . Twins 03/20/2015  . Vaginal bleeding in pregnancy 03/20/2015  . 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 Vaginal Bleeding   HPI:  As above  OB History    Gravida Para Term Preterm AB TAB SAB Ectopic Multiple Living   4 1 1  0 2 1 1  0 0 1      Past Medical History  Diagnosis Date  . Anemia   . H/O varicella   . Migraine   . Irregular periods/menstrual cycles   . Abnormal Pap smear 2012  . Low iron     Past Surgical History  Procedure Laterality Date  . Dilation and curettage of uterus  june 2012    Family History  Problem Relation Age of Onset  . Breast cancer Maternal Grandmother   . Cancer Maternal Grandmother     OVARIAN    Social History  Substance Use Topics  . Smoking status: Former Smoker -- 0.50 packs/day for 5 years    Types: Cigarettes  . Smokeless tobacco: Never Used  . Alcohol Use: No     Comment: occassionally  LAST TIME USED- JULY  1 ST    Allergies: No Known Allergies  Prescriptions prior to admission  Medication Sig Dispense Refill Last Dose  . Prenatal Vit-Fe Fumarate-FA (PRENATAL MULTIVITAMIN) TABS tablet Take 1 tablet by mouth daily at 12 noon.   03/19/2015 at Unknown time  . promethazine (PHENERGAN) 12.5 MG tablet Take 1 tablet (12.5 mg total) by mouth every 6 (six) hours as needed for nausea or vomiting. 60 tablet 2 03/20/2015 at Unknown time  . ibuprofen (ADVIL,MOTRIN) 600 MG tablet Take 1 tablet (600 mg total) by mouth every 6 (six) hours as needed for cramping. (Patient not taking: Reported  on 03/17/2015) 30 tablet 1 Not Taking at Unknown time    ROS:  Small amount bleeding this am, mild cramping, nausea Physical Exam   Blood pressure 118/61, pulse 84, temperature 97.8 F (36.6 C), temperature source Oral, resp. rate 18, height 5\' 7"  (1.702 m), weight 85.276 kg (188 lb), last menstrual period 09/27/2014.    Physical Exam  In NAD Chest clear Heart RRR without murmur Abd gravid, NT, FH 16 weeks. Pelvic--small amount brown dc, cervix appears closed, NT Ext WNL  FHR x 2 visible simultaneously by bedside informal US--160, 155.    ED Course  Assessment: Twin IUP at 12 4/7 weeks Bleeding today--now resolved to brown d/c B+  Plan: US for evaluation of bleeding.   Nigel BridgemanLATHAM, Dezeray Puccio CNM, MSN 03/20/2015 11:53 AM  Addendum: Returned from US.  No bleeding, small amount brown d/c.  FINDINGS: TWIN A  Intrauterine gestational sac: Visualized/normal in shape.  Yolk sac: Not present  Embryo: Present  Cardiac Activity: Present  Heart Rate: 161 bpm  CRL: 73 mm 13 w 3 d US EDC: 09/22/2015  TWIN B  Intrauterine gestational sac: Visualized/normal in shape.  Yolk sac: Not present  Embryo: Present  Cardiac Activity: Present  Heart Rate: 148 bpm  CRL: 71.5 mm 13 w  2 d [place] EDC: 09/23/2015  Maternal uterus/adnexae: The ovaries are within normal limits. No significant free pelvic fluid is noted. A minimal subchorionic hemorrhage is noted.  IMPRESSION: Twin viable intrauterine gestations as described above.  Minimal subchorionic hemorrhage is noted. No other focal abnormality is seen.   Electronically Signed  By: Alcide Clever M.D.  On: 03/20/2015 13:57   Impression: Twin IUP with 1st trimester bleeding [facility]  Plan: D/C home with bleeding precautions. Keep scheduled appt at [facility] in 4 weeks--call prn.  Nigel Bridgeman, CNM 03/20/15 2:35p

## 2015-05-31 NOTE — L&D Delivery Note (Addendum)
  Teresa Farley, Boy Garnet [[id number]][[id number]]  Delivery Note At 5:07 PM a viable female "Adriane" was delivered via Vaginal, Spontaneous Delivery, Presentation: Left Occiput Anterior that restituted to Right occiput anterior .  APGAR: 8, 9; weight 8 lb 2.2 oz (3690 g).   Placenta status: undelivered pending delivery of Baby B,  Cord: 3 vessels with the following complications: None. Cord pH: N/A  Anesthesia: Epidural  Episiotomy: None Lacerations:  None Suture Repair: none Est. Blood Loss (mL):  50 ml  Mom to remains in delivery suite pending delivery of Baby B.  Baby A to Couplet care / Skin to Skin.  Teresa Farley 09/13/2015, 7:50 PM  MD Note. Present for delivery of Baby A. After delivery of Baby A, ultrasound performed and Baby B was vertex, head 2-3 FBs above symphysis pubis.Pitocin started @ 2 mUs. Pt with pressure and desired to push, brought baby down slightly.  Head above symphysis pubis, ultrasound showed fetal head was vertex but LOT.  Pt instructed to rest, peanut placed, position changes recommended.  Teresa RathkeE. Benita Taleeya Blondin, MD

## 2015-06-01 ENCOUNTER — Inpatient Hospital Stay (HOSPITAL_COMMUNITY)
Admission: AD | Admit: 2015-06-01 | Discharge: 2015-06-01 | Disposition: A | Discharge status: Home / Self Care | Payer: No Typology Code available for payment source | Source: Ambulatory Visit | Attending: Obstetrics and Gynecology | Admitting: Obstetrics and Gynecology

## 2015-06-01 ENCOUNTER — Inpatient Hospital Stay (HOSPITAL_COMMUNITY): Payer: No Typology Code available for payment source

## 2015-06-01 ENCOUNTER — Encounter (HOSPITAL_COMMUNITY): Payer: Self-pay

## 2015-06-01 DIAGNOSIS — IMO0001 Reserved for inherently not codable concepts without codable children: Secondary | ICD-10-CM

## 2015-06-01 DIAGNOSIS — O9989 Other specified diseases and conditions complicating pregnancy, childbirth and the puerperium: Secondary | ICD-10-CM | POA: Diagnosis present

## 2015-06-01 DIAGNOSIS — O30042 Twin pregnancy, dichorionic/diamniotic, second trimester: Secondary | ICD-10-CM | POA: Insufficient documentation

## 2015-06-01 DIAGNOSIS — R1013 Epigastric pain: Secondary | ICD-10-CM | POA: Diagnosis not present

## 2015-06-01 DIAGNOSIS — Z3A23 23 weeks gestation of pregnancy: Secondary | ICD-10-CM | POA: Insufficient documentation

## 2015-06-01 LAB — URINALYSIS, ROUTINE W REFLEX MICROSCOPIC
Bilirubin Urine: NEGATIVE
Glucose, UA: NEGATIVE mg/dL
HGB URINE DIPSTICK: NEGATIVE
Ketones, ur: NEGATIVE mg/dL
Leukocytes, UA: NEGATIVE
Nitrite: NEGATIVE
PH: 7.5 (ref 5.0–8.0)
Protein, ur: NEGATIVE mg/dL
SPECIFIC GRAVITY, URINE: 1.02 (ref 1.005–1.030)

## 2015-06-01 LAB — CBC
HCT: 30.8 % — ABNORMAL LOW (ref 36.0–46.0)
Hemoglobin: 10.6 g/dL — ABNORMAL LOW (ref 12.0–15.0)
MCH: 31.5 pg (ref 26.0–34.0)
MCHC: 34.4 g/dL (ref 30.0–36.0)
MCV: 91.7 fL (ref 78.0–100.0)
PLATELETS: 139 10*3/uL — AB (ref 150–400)
RBC: 3.36 MIL/uL — AB (ref 3.87–5.11)
RDW: 13.6 % (ref 11.5–15.5)
WBC: 7 10*3/uL (ref 4.0–10.5)

## 2015-06-01 LAB — COMPREHENSIVE METABOLIC PANEL
ALT: 16 U/L (ref 14–54)
ANION GAP: 9 (ref 5–15)
AST: 19 U/L (ref 15–41)
Albumin: 3.1 g/dL — ABNORMAL LOW (ref 3.5–5.0)
Alkaline Phosphatase: 58 U/L (ref 38–126)
BUN: 7 mg/dL (ref 6–20)
CALCIUM: 8.6 mg/dL — AB (ref 8.9–10.3)
CHLORIDE: 108 mmol/L (ref 101–111)
CO2: 21 mmol/L — ABNORMAL LOW (ref 22–32)
CREATININE: 0.43 mg/dL — AB (ref 0.44–1.00)
Glucose, Bld: 75 mg/dL (ref 65–99)
Potassium: 3.6 mmol/L (ref 3.5–5.1)
SODIUM: 138 mmol/L (ref 135–145)
Total Bilirubin: 0.4 mg/dL (ref 0.3–1.2)
Total Protein: 6.7 g/dL (ref 6.5–8.1)

## 2015-06-01 LAB — AMYLASE: Amylase: 54 U/L (ref 28–100)

## 2015-06-01 LAB — LIPASE, BLOOD: LIPASE: 32 U/L (ref 11–51)

## 2015-06-01 MED ORDER — PANTOPRAZOLE SODIUM 20 MG PO TBEC: 20.0000 mg | DELAYED_RELEASE_TABLET | Freq: Every day | ORAL | Status: DC

## 2015-06-01 MED ORDER — GI COCKTAIL ~~LOC~~
30.0000 mL | Freq: Once | ORAL | Status: AC
  Administered 2015-06-01: 30 mL via ORAL
  Filled 2015-06-01: qty 30

## 2015-06-01 NOTE — Discharge Instructions (Signed)
Heartburn During Pregnancy Heartburn is a burning sensation in the chest caused by stomach acid backing up into the esophagus. Heartburn is common in pregnancy because a certain hormone (progesterone) is released when a woman is pregnant. The progesterone hormone may relax the valve that separates the esophagus from the stomach. This allows acid to go up into the esophagus, causing heartburn. Heartburn may also happen in pregnancy because the enlarging uterus pushes up on the stomach, which pushes more acid into the esophagus. This is especially true in the later stages of pregnancy. Heartburn problems usually go away after giving birth. CAUSES  Heartburn is caused by stomach acid backing up into the esophagus. During pregnancy, this may result from various things, including:   The progesterone hormone.  Changing hormone levels.  The growing uterus pushing stomach acid upward.  Large meals.  Certain foods and drinks.  Exercise.  Increased acid production. SIGNS AND SYMPTOMS   Burning pain in the chest or lower throat.  Bitter taste in the mouth.  Coughing. DIAGNOSIS  Your health care provider will typically diagnose heartburn by taking a careful history of your concern. Blood tests may be done to check for a certain type of bacteria that is associated with heartburn. Sometimes, heartburn is diagnosed by prescribing a heartburn medicine to see if the symptoms improve. In some cases, a procedure called an endoscopy may be done. In this procedure, a tube with a light and a camera on the end (endoscope) is used to examine the esophagus and the stomach. TREATMENT  Treatment will vary depending on the severity of your symptoms. Your health care provider may recommend:  Over-the-counter medicines (antacids, acid reducers) for mild heartburn.  Prescription medicines to decrease stomach acid or to protect your stomach lining.  Certain changes in your diet.  Elevating the head of your bed  by putting blocks under the legs. This helps prevent stomach acid from backing up into the esophagus when you are lying down. HOME CARE INSTRUCTIONS   Only take over-the-counter or prescription medicines as directed by your health care provider.  Raise the head of your bed by putting blocks under the legs if instructed to do so by your health care provider. Sleeping with more pillows is not effective because it only changes the position of your head.  Do not exercise right after eating.  Avoid eating 2-3 hours before bed. Do not lie down right after eating.  Eat small meals throughout the day instead of three large meals.  Identify foods and beverages that make your symptoms worse and avoid them. Foods you may want to avoid include:  Peppers.  Chocolate.  High-fat foods, including fried foods.  Spicy foods.  Garlic and onions.  Citrus fruits, including oranges, grapefruit, lemons, and limes.  Food containing tomatoes or tomato products.  Mint.  Carbonated and caffeinated drinks.  Vinegar. SEEK MEDICAL CARE IF:  You have abdominal pain of any kind.  You feel burning in your upper abdomen or chest, especially after eating or lying down.  You have nausea and vomiting.  Your stomach feels upset after you eat. SEEK IMMEDIATE MEDICAL CARE IF:   You have severe chest pain that goes down your arm or into your jaw or neck.  You feel sweaty, dizzy, or light-headed.  You become short of breath.  You vomit blood.  You have difficulty or pain with swallowing.  You have bloody or black, tarry stools.  You have episodes of heartburn more than 3 times a week,  for more than 2 weeks. MAKE SURE YOU:  Understand these instructions.  Will watch your condition.  Will get help right away if you are not doing well or get worse.   This information is not intended to replace advice given to you by your health care provider. Make sure you discuss any questions you have with  your health care provider.  Dr. Estanislado Pandyivard will prescribe Protonix electronically, you can take Tums Calcium Based until Protonix takes effect.    Document Released: 05/13/2000 Document Revised: 06/06/2014 Document Reviewed: 01/02/2013 Elsevier Interactive Patient Education [employer]2016 Elsevier Inc.

## 2015-06-01 NOTE — MAU Note (Signed)
Called Venus Standard CNM with report was told to call Dr. Estanislado Pandyivard, Dr. Estanislado Pandyivard in OR report given, orders received.

## 2015-06-01 NOTE — MAU Note (Signed)
Toco reapplied, patient states epigastric pain is coming back, 2/10, left lower quadrant pain intermittent 8/10.

## 2015-06-01 NOTE — MAU Note (Signed)
Patient states GI cocktail helped with epigastric pain however left lower abdominal pain still there.

## 2015-06-01 NOTE — MAU Note (Signed)
Onset of epigastric pain around 7:20 no left lower quadrant pain, denies bleeding, twin gestation.

## 2015-06-01 NOTE — MAU Note (Signed)
Notified Dr. Estanislado Pandyivard all of results, received order to discharge to home.  MD to prescribe Protonix.

## 2015-06-01 NOTE — MAU Note (Addendum)
Teresa Farley is a 30 y.o. female presenting via EMS for acute epigastric pain.  HPI:  Patient reports sudden onset of acute epigastric pain while driving to work with difficulty catching her breath. Intensity 8/10 with no radiation. No nausea or vomiting. Had a normal breakfast this morning. Also describes LLG pain milder, on and off. Denies contractions, VB and LOF. Reports good fetal activity x 2.   ROS:  Cardiovascular: no dyspnea or chest pain GI: ongoing nausea controlled with Zofran. No worsening today. No vomiting. Constipation 2nd to Zofran is unchanged. No fever reported. GU: denies dysuria EXT: denies pain or swelling  History OB History    Gravida Para Term Preterm AB TAB SAB Ectopic Multiple Living   4 1 1  0 2 1 1  0 0 1     Past Medical History  Diagnosis Date  . Anemia   . H/O varicella   . Irregular periods/menstrual cycles   . Abnormal Pap smear 2012  . Low iron   . Condyloma 02/23/2011    [facility]    Past Surgical History  Procedure Laterality Date  . Dilation and curettage of uterus  june 2012      Blood pressure 115/63, pulse 87, temperature 98.6 F (37 C), temperature source Oral, resp. rate 24, last menstrual period 09/27/2014, SpO2 100 %.  General Appearance: Alert, appropriate appearance for age. No acute distress but uncomfortable HEENT Exam: Grossly normal Chest/Respiratory Exam: Normal chest wall and respirations. Clear to auscultation  Cardiovascular Exam: Regular rate and rhythm. S1, S2, no murmur Gastrointestinal Exam: soft, Uterus gravid with size compatible with twin GA. Epigastric tenderness with no guarding and no rebound Psychiatric Exam: Alert and oriented, appropriate affect Extremities: Homan negative. No significant edema  Fetal monitoring: FHT normal x 2. No contractions on toco  ++++++++++++++++++++++++++++++++++++++++++++++++++++++++++++++++  Vaginal  exam: deferred at this time   ++++++++++++++++++++++++++++++++++++++++++++++++++++++++++++++++  Prenatal labs: ABO, Rh: B+ Antibody:  egative Rubella: immune RPR: negative HBsAg: Negative HIV: negative   +++++++++++++++++++++++++++++++++++++++++++++++++++++++++++++++  Assessment and plan:  Acute epigastric pain likely GI.  Await CBC, CMET, Lipase and Amylase Patient will be given GI cocktail for relief Will order ultrasound    Silverio LaySandra Tamirra Sienkiewicz MD  All labs are normal including abdominal and OB limited ultrasounds. Patient had full relief of pain after GI cocktail but now appears to be coming back. LLQ has subsided.  Assessment: epigastric pain likely due to GERD  Plan: Protonix prescribed. Patient instructed on GERD friendly diet and to use TUMS as needed   Patient will keep her scheduled follow-up in the office in 3 weeks but is instructed to call if symptoms worsen

## 2015-06-01 NOTE — MAU Note (Signed)
Dr. Estanislado Pandyivard at bedside.

## 2015-07-07 ENCOUNTER — Inpatient Hospital Stay (HOSPITAL_COMMUNITY)
Admission: AD | Admit: 2015-07-07 | Discharge: 2015-07-07 | Disposition: A | Discharge status: Home / Self Care | Payer: PRIVATE HEALTH INSURANCE | Source: Ambulatory Visit | Attending: Obstetrics and Gynecology | Admitting: Obstetrics and Gynecology

## 2015-07-07 ENCOUNTER — Encounter (HOSPITAL_COMMUNITY): Payer: Self-pay | Admitting: *Deleted

## 2015-07-07 DIAGNOSIS — O212 Late vomiting of pregnancy: Secondary | ICD-10-CM | POA: Diagnosis not present

## 2015-07-07 DIAGNOSIS — Z87891 Personal history of nicotine dependence: Secondary | ICD-10-CM | POA: Insufficient documentation

## 2015-07-07 DIAGNOSIS — O30003 Twin pregnancy, unspecified number of placenta and unspecified number of amniotic sacs, third trimester: Secondary | ICD-10-CM | POA: Diagnosis not present

## 2015-07-07 DIAGNOSIS — R112 Nausea with vomiting, unspecified: Secondary | ICD-10-CM | POA: Insufficient documentation

## 2015-07-07 DIAGNOSIS — Z3A28 28 weeks gestation of pregnancy: Secondary | ICD-10-CM | POA: Insufficient documentation

## 2015-07-07 LAB — COMPREHENSIVE METABOLIC PANEL
ALK PHOS: 84 U/L (ref 38–126)
ALT: 19 U/L (ref 14–54)
AST: 20 U/L (ref 15–41)
Albumin: 3.1 g/dL — ABNORMAL LOW (ref 3.5–5.0)
Anion gap: 6 (ref 5–15)
BUN: 6 mg/dL (ref 6–20)
CALCIUM: 8.5 mg/dL — AB (ref 8.9–10.3)
CO2: 23 mmol/L (ref 22–32)
CREATININE: 0.44 mg/dL (ref 0.44–1.00)
Chloride: 105 mmol/L (ref 101–111)
Glucose, Bld: 93 mg/dL (ref 65–99)
Potassium: 4.2 mmol/L (ref 3.5–5.1)
Sodium: 134 mmol/L — ABNORMAL LOW (ref 135–145)
Total Bilirubin: 0.6 mg/dL (ref 0.3–1.2)
Total Protein: 6.9 g/dL (ref 6.5–8.1)

## 2015-07-07 LAB — CBC WITH DIFFERENTIAL/PLATELET
BASOS ABS: 0 10*3/uL (ref 0.0–0.1)
Basophils Relative: 0 %
Eosinophils Absolute: 0.1 10*3/uL (ref 0.0–0.7)
Eosinophils Relative: 1 %
HEMATOCRIT: 31.1 % — AB (ref 36.0–46.0)
HEMOGLOBIN: 10.5 g/dL — AB (ref 12.0–15.0)
LYMPHS ABS: 1.7 10*3/uL (ref 0.7–4.0)
LYMPHS PCT: 20 %
MCH: 30.6 pg (ref 26.0–34.0)
MCHC: 33.8 g/dL (ref 30.0–36.0)
MCV: 90.7 fL (ref 78.0–100.0)
Monocytes Absolute: 0.4 10*3/uL (ref 0.1–1.0)
Monocytes Relative: 5 %
NEUTROS ABS: 6.3 10*3/uL (ref 1.7–7.7)
NEUTROS PCT: 74 %
Platelets: 142 10*3/uL — ABNORMAL LOW (ref 150–400)
RBC: 3.43 MIL/uL — AB (ref 3.87–5.11)
RDW: 13.4 % (ref 11.5–15.5)
WBC: 8.5 10*3/uL (ref 4.0–10.5)

## 2015-07-07 LAB — URINALYSIS, ROUTINE W REFLEX MICROSCOPIC
BILIRUBIN URINE: NEGATIVE
Glucose, UA: NEGATIVE mg/dL
HGB URINE DIPSTICK: NEGATIVE
Ketones, ur: 15 mg/dL — AB
Leukocytes, UA: NEGATIVE
NITRITE: NEGATIVE
PH: 6.5 (ref 5.0–8.0)
Protein, ur: 30 mg/dL — AB
SPECIFIC GRAVITY, URINE: 1.02 (ref 1.005–1.030)

## 2015-07-07 LAB — AMYLASE: AMYLASE: 52 U/L (ref 28–100)

## 2015-07-07 LAB — URINE MICROSCOPIC-ADD ON: RBC / HPF: NONE SEEN RBC/hpf (ref 0–5)

## 2015-07-07 LAB — LIPASE, BLOOD: LIPASE: 23 U/L (ref 11–51)

## 2015-07-07 MED ORDER — LACTATED RINGERS IV SOLN: INTRAVENOUS | Status: DC

## 2015-07-07 MED ORDER — DOXYLAMINE-PYRIDOXINE 10-10 MG PO TBEC: 2.0000 | DELAYED_RELEASE_TABLET | Freq: Every evening | ORAL | Status: DC | PRN

## 2015-07-07 MED ORDER — PANTOPRAZOLE SODIUM 40 MG IV SOLR
40.0000 mg | Freq: Once | INTRAVENOUS | Status: DC
  Filled 2015-07-07: qty 40

## 2015-07-07 MED ORDER — LACTATED RINGERS IV BOLUS (SEPSIS)
500.0000 mL | Freq: Once | INTRAVENOUS | Status: AC
  Administered 2015-07-07: 1000 mL via INTRAVENOUS

## 2015-07-07 MED ORDER — ONDANSETRON HCL 4 MG/2ML IJ SOLN
4.0000 mg | Freq: Four times a day (QID) | INTRAMUSCULAR | Status: DC
  Administered 2015-07-07: 4 mg via INTRAVENOUS
  Filled 2015-07-07: qty 2

## 2015-07-07 NOTE — Discharge Instructions (Signed)

## 2015-07-07 NOTE — MAU Provider Note (Signed)
History   30 yo G4P1021 at 12 1/7 weeks presented after calling office to report worsening N/V x 24 hours, now unable to keep f/f down.  Has struggled with N/V throughout pregnancy, has been on Zofran during pregnancy (thinks the Zofran "has stopped working").  Denies fever, diarrhea, any viral exposures, dysuria, contractions, leaking, bleeding, or any other issues.  Reports +FM.    Patient Active Problem List   Diagnosis Date Noted  . Twins 03/20/2015  . Vaginal bleeding in pregnancy 03/20/2015  . 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 Dehydration   HPI:  See above  OB History    Gravida Para Term Preterm AB TAB SAB Ectopic Multiple Living   0 0 0 1      Past Medical History  Diagnosis Date  . Anemia   . H/O varicella   . Irregular periods/menstrual cycles   . Abnormal Pap smear 2012  . Low iron   . Condyloma 02/23/2011    Patient states that she is receiving treatment from [facility]     Past Surgical History  Procedure Laterality Date  . Dilation and curettage of uterus  june 2012    Family History  Problem Relation Age of Onset  . Breast cancer Maternal Grandmother   . Cancer Maternal Grandmother     OVARIAN    Social History  Substance Use Topics  . Smoking status: Former Smoker -- 0.50 packs/day for 5 years    Types: Cigarettes  . Smokeless tobacco: Never Used  . Alcohol Use: No     Comment: occassionally  LAST TIME USED- JULY  1 ST    Allergies: No Known Allergies  Prescriptions prior to admission  Medication Sig Dispense Refill Last Dose  . ondansetron (ZOFRAN) 4 MG tablet Take 4 mg by mouth every 8 (eight) hours as needed for nausea or vomiting.   07/06/2015 at Unknown time  . pantoprazole (PROTONIX) 20 MG tablet Take 1 tablet (20 mg total) by mouth daily. (Patient taking differently: Take 20 mg by mouth daily as needed for heartburn. ) 30 tablet 4 Past Month at Unknown time  . Prenatal Vit-Fe  Fumarate-FA (PRENATAL MULTIVITAMIN) TABS tablet Take 1 tablet by mouth daily at 12 noon.   07/07/2015 at Unknown time  . acetaminophen (TYLENOL) 325 MG tablet Take 650 mg by mouth every 6 (six) hours as needed for mild pain.   prn    ROS:  Nausea, previous vomiting, +FM, pelvic pressure. Physical Exam   Blood pressure 112/56, pulse 91, temperature 98.4 F (36.9 C), temperature source Oral, resp. rate 18, height  (1.702 m), weight 97.796 kg (215 lb 9.6 oz), last menstrual period 09/27/2014.    Physical Exam  In NAD Chest clear Heart RRR without murmur Abd soft, NT Pelvic--deferred at present. Ext WNL  FHR Category 1 for both babies No UCs.  ED Course  Assessment: Twin IUP at 10 1/7 weeks N/V of pregnancy--current exacerbation  Plan: IV hydration CBC, CMP, amylase, lipase Zofran   Ray Church, MSN 07/07/2015 11:36 AM  Addendum:  Results for orders placed or performed during the hospital encounter of 07/07/15 (from the past 24 hour(s))  Urinalysis, Routine w reflex microscopic (not at [facility])     Status: Abnormal   Collection Time: 07/07/15  9:47 AM  Result Value Ref Range   Color, Urine YELLOW YELLOW   APPearance CLEAR CLEAR   Specific Gravity,  Urine 1.020 1.005 - 1.030   pH 6.5 5.0 - 8.0   Glucose, UA NEGATIVE NEGATIVE mg/dL   Hgb urine dipstick NEGATIVE NEGATIVE   Bilirubin Urine NEGATIVE NEGATIVE   Ketones, ur 15 (A) NEGATIVE mg/dL   Protein, ur 30 (A) NEGATIVE mg/dL   Nitrite NEGATIVE NEGATIVE   Leukocytes, UA NEGATIVE NEGATIVE  Urine microscopic-add on     Status: Abnormal   Collection Time: 07/07/15  9:47 AM  Result Value Ref Range   Squamous Epithelial / LPF 0-5 (A) NONE SEEN   WBC, UA 0-5 0 - 5 WBC/hpf   RBC / HPF NONE SEEN 0 - 5 RBC/hpf   Bacteria, UA FEW (A) NONE SEEN  CBC with Differential/Platelet     Status: Abnormal   Collection Time: 07/07/15  9:48 AM  Result Value Ref Range   WBC 8.5 4.0 - 10.5 K/uL   RBC 3.43 (L) 3.87 - 5.11 MIL/uL    Hemoglobin 10.5 (L) 12.0 - 15.0 g/dL   HCT 96.0 (L) 45.4 - 09.8 %   MCV 90.7 78.0 - 100.0 fL   MCH 30.6 26.0 - 34.0 pg   MCHC 33.8 30.0 - 36.0 g/dL   RDW 11.9 14.7 - 82.9 %   Platelets 142 (L) 150 - 400 K/uL   Neutrophils Relative % 74 %   Neutro Abs 6.3 1.7 - 7.7 K/uL   Lymphocytes Relative 20 %   Lymphs Abs 1.7 0.7 - 4.0 K/uL   Monocytes Relative 5 %   Monocytes Absolute 0.4 0.1 - 1.0 K/uL   Eosinophils Relative 1 %   Eosinophils Absolute 0.1 0.0 - 0.7 K/uL   Basophils Relative 0 %   Basophils Absolute 0.0 0.0 - 0.1 K/uL  Comprehensive metabolic panel     Status: Abnormal   Collection Time: 07/07/15  9:48 AM  Result Value Ref Range   Sodium 134 (L) 135 - 145 mmol/L   Potassium 4.2 3.5 - 5.1 mmol/L   Chloride 105 101 - 111 mmol/L   CO2 23 22 - 32 mmol/L   Glucose, Bld 93 65 - 99 mg/dL   BUN 6 6 - 20 mg/dL   Creatinine, Ser 5.62 0.44 - 1.00 mg/dL   Calcium 8.5 (L) 8.9 - 10.3 mg/dL   Total Protein 6.9 6.5 - 8.1 g/dL   Albumin 3.1 (L) 3.5 - 5.0 g/dL   AST 20 15 - 41 U/L   ALT 19 14 - 54 U/L   Alkaline Phosphatase 84 38 - 126 U/L   Total Bilirubin 0.6 0.3 - 1.2 mg/dL   GFR calc non Af Amer >60 >60 mL/min   GFR calc Af Amer >60 >60 mL/min   Anion gap 6 5 - 15  Amylase     Status: None   Collection Time: 07/07/15  9:48 AM  Result Value Ref Range   Amylase 52 28 - 100 U/L  Lipase, blood     Status: None   Collection Time: 07/07/15  9:48 AM  Result Value Ref Range   Lipase 23 11 - 51 U/L   IV hydration and Zofran completed. Feeling much better, ready to go home.  Babies very active, FHRs Category 1. Mild irritability, patient unaware. Cervix closed, long.  D/C home Rx Diclegis sent to pharmacy. F/u as scheduled with [facility] 07/15/15, or call prn.  Nigel Bridgeman, CNM 07/07/15 1400

## 2015-07-07 NOTE — MAU Note (Signed)
Urine in lab 

## 2015-07-07 NOTE — MAU Note (Signed)
Pt states she felt dehydrated since she has been unable to keep anything down since yesterday morning.  Pt states she has lower abdominal cramping.

## 2015-08-01 ENCOUNTER — Inpatient Hospital Stay (HOSPITAL_COMMUNITY): Payer: No Typology Code available for payment source

## 2015-08-01 ENCOUNTER — Inpatient Hospital Stay (HOSPITAL_COMMUNITY)
Admission: AD | Admit: 2015-08-01 | Discharge: 2015-08-02 | DRG: 778 | Disposition: A | Discharge status: Home / Self Care | Payer: No Typology Code available for payment source | Source: Ambulatory Visit | Attending: Obstetrics and Gynecology | Admitting: Obstetrics and Gynecology

## 2015-08-01 ENCOUNTER — Encounter (HOSPITAL_COMMUNITY): Payer: Self-pay | Admitting: *Deleted

## 2015-08-01 DIAGNOSIS — O30043 Twin pregnancy, dichorionic/diamniotic, third trimester: Secondary | ICD-10-CM | POA: Diagnosis present

## 2015-08-01 DIAGNOSIS — Z3A31 31 weeks gestation of pregnancy: Secondary | ICD-10-CM | POA: Diagnosis not present

## 2015-08-01 DIAGNOSIS — Z87891 Personal history of nicotine dependence: Secondary | ICD-10-CM

## 2015-08-01 DIAGNOSIS — R52 Pain, unspecified: Secondary | ICD-10-CM

## 2015-08-01 DIAGNOSIS — R1032 Left lower quadrant pain: Secondary | ICD-10-CM | POA: Diagnosis not present

## 2015-08-01 DIAGNOSIS — IMO0001 Reserved for inherently not codable concepts without codable children: Secondary | ICD-10-CM

## 2015-08-01 LAB — DIC (DISSEMINATED INTRAVASCULAR COAGULATION) PANEL
D DIMER QUANT: 1.45 ug{FEU}/mL — AB (ref 0.00–0.50)
INR: 1.11 (ref 0.00–1.49)
PROTHROMBIN TIME: 14.4 s (ref 11.6–15.2)
SMEAR REVIEW: NONE SEEN

## 2015-08-01 LAB — CBC WITH DIFFERENTIAL/PLATELET
BASOS ABS: 0 10*3/uL (ref 0.0–0.1)
BASOS PCT: 0 %
EOS ABS: 0.1 10*3/uL (ref 0.0–0.7)
EOS PCT: 1 %
HCT: 30.3 % — ABNORMAL LOW (ref 36.0–46.0)
HEMOGLOBIN: 10.1 g/dL — AB (ref 12.0–15.0)
Lymphocytes Relative: 20 %
Lymphs Abs: 1.4 10*3/uL (ref 0.7–4.0)
MCH: 29.6 pg (ref 26.0–34.0)
MCHC: 33.3 g/dL (ref 30.0–36.0)
MCV: 88.9 fL (ref 78.0–100.0)
MONO ABS: 0.2 10*3/uL (ref 0.1–1.0)
MONOS PCT: 3 %
NEUTROS ABS: 5.3 10*3/uL (ref 1.7–7.7)
Neutrophils Relative %: 76 %
Platelets: 125 10*3/uL — ABNORMAL LOW (ref 150–400)
RBC: 3.41 MIL/uL — AB (ref 3.87–5.11)
RDW: 13.7 % (ref 11.5–15.5)
WBC: 7 10*3/uL (ref 4.0–10.5)

## 2015-08-01 LAB — TYPE AND SCREEN
ABO/RH(D): B POS
Antibody Screen: NEGATIVE

## 2015-08-01 LAB — KLEIHAUER-BETKE STAIN
# VIALS RHIG: 1
FETAL CELLS %: 0 %
QUANTITATION FETAL HEMOGLOBIN: 0 mL

## 2015-08-01 LAB — DIC (DISSEMINATED INTRAVASCULAR COAGULATION)PANEL
Fibrinogen: 425 mg/dL (ref 204–475)
Platelets: 130 10*3/uL — ABNORMAL LOW (ref 150–400)
aPTT: 24 seconds (ref 24–37)

## 2015-08-01 MED ORDER — LACTATED RINGERS IV SOLN
INTRAVENOUS | Status: DC
  Administered 2015-08-01 – 2015-08-02 (×4): via INTRAVENOUS

## 2015-08-01 MED ORDER — CYCLOBENZAPRINE HCL 10 MG PO TABS
10.0000 mg | ORAL_TABLET | Freq: Three times a day (TID) | ORAL | Status: DC | PRN
Start: 2015-08-01 — End: 2015-08-02

## 2015-08-01 MED ORDER — BUTORPHANOL TARTRATE 1 MG/ML IJ SOLN
1.0000 mg | Freq: Once | INTRAMUSCULAR | Status: AC
  Administered 2015-08-01: 1 mg via INTRAVENOUS
  Filled 2015-08-01: qty 1

## 2015-08-01 MED ORDER — LACTATED RINGERS IV BOLUS (SEPSIS)
500.0000 mL | Freq: Once | INTRAVENOUS | Status: AC
  Administered 2015-08-01: 500 mL via INTRAVENOUS

## 2015-08-01 MED ORDER — ACETAMINOPHEN 325 MG PO TABS: 650.0000 mg | ORAL_TABLET | ORAL | Status: DC | PRN

## 2015-08-01 MED ORDER — NIFEDIPINE 10 MG PO CAPS
10.0000 mg | ORAL_CAPSULE | Freq: Four times a day (QID) | ORAL | Status: DC | PRN
  Administered 2015-08-01: 10 mg via ORAL
  Filled 2015-08-01: qty 1

## 2015-08-01 MED ORDER — MORPHINE BOLUS VIA INFUSION: 4.0000 mg | INTRAVENOUS | Status: DC | PRN

## 2015-08-01 MED ORDER — OXYCODONE-ACETAMINOPHEN 5-325 MG PO TABS: 1.0000 | ORAL_TABLET | ORAL | Status: DC | PRN

## 2015-08-01 MED ORDER — PRENATAL MULTIVITAMIN CH
1.0000 | ORAL_TABLET | Freq: Every day | ORAL | Status: DC
  Administered 2015-08-02: 1 via ORAL
  Filled 2015-08-01: qty 1

## 2015-08-01 MED ORDER — CYCLOBENZAPRINE HCL 10 MG PO TABS
10.0000 mg | ORAL_TABLET | Freq: Once | ORAL | Status: AC
  Administered 2015-08-01: 10 mg via ORAL
  Filled 2015-08-01: qty 1

## 2015-08-01 MED ORDER — CALCIUM CARBONATE ANTACID 500 MG PO CHEW: 2.0000 | CHEWABLE_TABLET | ORAL | Status: DC | PRN

## 2015-08-01 MED ORDER — LACTATED RINGERS IV BOLUS (SEPSIS)
1000.0000 mL | Freq: Once | INTRAVENOUS | Status: AC
  Administered 2015-08-01: 1000 mL via INTRAVENOUS

## 2015-08-01 MED ORDER — ZOLPIDEM TARTRATE 5 MG PO TABS: 5.0000 mg | ORAL_TABLET | Freq: Every evening | ORAL | Status: DC | PRN

## 2015-08-01 MED ORDER — DOCUSATE SODIUM 100 MG PO CAPS
100.0000 mg | ORAL_CAPSULE | Freq: Every day | ORAL | Status: DC
  Filled 2015-08-01: qty 1

## 2015-08-01 MED ORDER — MORPHINE SULFATE (PF) 4 MG/ML IV SOLN
4.0000 mg | INTRAVENOUS | Status: DC | PRN
  Administered 2015-08-01 (×2): 4 mg via INTRAVENOUS
  Filled 2015-08-01 (×2): qty 1

## 2015-08-01 NOTE — Progress Notes (Addendum)
Teresa Farley is a 30 y.o. G4P1021 at 31.5 weeks presented to US c/o left lower abd and flank pain that started this morning and got worse about an hour ago.  Denies vb, lof w/decrease fetal movement since this morning.   History     Patient Active Problem List   Diagnosis Date Noted  . Twins 03/20/2015  . Vaginal bleeding in pregnancy 03/20/2015  . 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 Contractions   HPI  OB History    Gravida Para Term Preterm AB TAB SAB Ectopic Multiple Living   4 1 1  0 2 1 1  0 0 1      Past Medical History  Diagnosis Date  . Anemia   . H/O varicella   . Irregular periods/menstrual cycles   . Abnormal Pap smear 2012  . Low iron   . Condyloma 02/23/2011    Patient [facility]     Past Surgical History  Procedure Laterality Date  . Dilation and curettage of uterus  june 2012    Family History  Problem Relation Age of Onset  . Breast cancer Maternal Grandmother   . Cancer Maternal Grandmother     OVARIAN    Social History  Substance Use Topics  . Smoking status: Former Smoker -- 0.50 packs/day for 5 years    Types: Cigarettes  . Smokeless tobacco: Never Used  . Alcohol Use: No     Comment: occassionally  LAST TIME USED- JULY  1 ST    Allergies: No Known Allergies  Prescriptions prior to admission  Medication Sig Dispense Refill Last Dose  . acetaminophen (TYLENOL) 325 MG tablet Take 650 mg by mouth every 6 (six) hours as needed for mild pain.   prn  . Doxylamine-Pyridoxine (DICLEGIS) 10-10 MG TBEC Take 2 tablets by mouth at bedtime as needed. 60 tablet 2   . ondansetron (ZOFRAN) 4 MG tablet Take 4 mg by mouth every 8 (eight) hours as needed for nausea or vomiting.   07/06/2015 at Unknown time  . pantoprazole (PROTONIX) 20 MG tablet Take 1 tablet (20 mg total) by mouth daily. (Patient taking differently: Take 20 mg by mouth daily as needed for  heartburn. ) 30 tablet 4 Past Month at Unknown time  . Prenatal Vit-Fe Fumarate-FA (PRENATAL MULTIVITAMIN) TABS tablet Take 1 tablet by mouth daily at 12 noon.   07/07/2015 at Unknown time    ROS See HPI above, all other systems are negative  Physical Exam   Blood pressure 124/68, last menstrual period 09/27/2014.  Physical Exam Ext:  WNL ABD: Soft, non tender to palpation, no rebound or guarding SVD: C/T/H   ED Course  Assessment: IUP at  31.5weeks Membranes: intact FHR: Category 1 x2 CTX:  2 minutes Lower abd pain more on the left. Lower back pain more on the left    Plan: Labs: CBC, COAG, DIC, KB, fibrinogen, Pt, PTT, urine IVF bolus then cont at 1215cc/hr Stadol Flexeril  OB US to r/o abruption Renal US  Dr. Richardson Doppole called to the bedside    Teresa Farley, CNM, MSN 08/01/2015. 11:57 AM

## 2015-08-01 NOTE — MAU Note (Signed)
Back pain all morning.

## 2015-08-01 NOTE — Progress Notes (Addendum)
Antepartum LOS: 0 Teresa Farley, 30 y.o.,   OB History    Gravida Para Term Preterm AB TAB SAB Ectopic Multiple Living   0 0 0 1      Subjective -Nurse call and reports that patient with increased pain and requesting pain medication.  In room to assess.  Patient reports sharp shooting pain that starts in her back and radiates into her upper abdominal area. Reports pain is intermittent in nature, but is not as bad as when initially admitted.  Patient endorses good fetal movement and denies LoF and VB.    Objective  Filed Vitals:   08/01/15 1325 08/01/15 1335 08/01/15 1632 08/01/15 1951  BP: 116/70  117/67 98/43  Pulse: 93  85 107  Temp: 98.2 F (36.8 C)  98.2 F (36.8 C) 98.2 F (36.8 C)  TempSrc: Oral  Oral Oral  Resp: Height:   (1.702 m)    Weight:  101.606 kg (224 lb)      Results for orders placed or performed during the hospital encounter of 08/01/15 (from the past 24 hour(s))  Type and screen [facility] [place]     Status: None   Collection Time: 08/01/15 11:36 AM  Result Value Ref Range   ABO/RH(D) B POS    Antibody Screen NEG    Sample Expiration 08/04/2015   CBC with Differential/Platelet     Status: Abnormal   Collection Time: 08/01/15 12:23 PM  Result Value Ref Range   WBC 7.0 4.0 - 10.5 K/uL   RBC 3.41 (L) 3.87 - 5.11 MIL/uL   Hemoglobin 10.1 (L) 12.0 - 15.0 g/dL   HCT 91.4 (L) 78.2 - 95.6 %   MCV 88.9 78.0 - 100.0 fL   MCH 29.6 26.0 - 34.0 pg   MCHC 33.3 30.0 - 36.0 g/dL   RDW 21.3 08.6 - 57.8 %   Platelets 125 (L) 150 - 400 K/uL   Neutrophils Relative % 76 %   Neutro Abs 5.3 1.7 - 7.7 K/uL   Lymphocytes Relative 20 %   Lymphs Abs 1.4 0.7 - 4.0 K/uL   Monocytes Relative 3 %   Monocytes Absolute 0.2 0.1 - 1.0 K/uL   Eosinophils Relative 1 %   Eosinophils Absolute 0.1 0.0 - 0.7 K/uL   Basophils Relative 0 %   Basophils Absolute 0.0 0.0 - 0.1 K/uL  Kleihauer-Betke stain     Status: None   Collection Time:  08/01/15 12:23 PM  Result Value Ref Range   Fetal Cells % 0 %   Quantitation Fetal Hemoglobin 0 mL   # Vials RhIg 1   DIC (disseminated intravasc coag) panel     Status: Abnormal   Collection Time: 08/01/15 12:23 PM  Result Value Ref Range   Prothrombin Time 14.4 11.6 - 15.2 seconds   INR 1.11 0.00 - 1.49   aPTT 24 24 - 37 seconds   Fibrinogen 425 204 - 475 mg/dL   D-Dimer, Quant 4.69 (H) 0.00 - 0.50 ug/mL-FEU   Platelets 130 (L) 150 - 400 K/uL   Smear Review NO SCHISTOCYTES SEEN     Meds: Scheduled Meds: . docusate sodium  100 mg Oral Daily  . prenatal multivitamin  1 tablet Oral Q1200   Continuous Infusions: . lactated ringers 125 mL/hr at 08/01/15 1533   PRN Meds:.acetaminophen, calcium carbonate, cyclobenzaprine, morphine injection, oxyCODONE-acetaminophen, zolpidem   Physical Exam  Constitutional: She is oriented to person,  place, and time. She appears well-developed and well-nourished. She appears distressed (During reports of pain-Palpates as moderate ctx).  HENT:  Head: Normocephalic and atraumatic.  Eyes: Conjunctivae are normal.  Neck: Normal range of motion.  Cardiovascular: Normal rate, regular rhythm and normal heart sounds.   Pulmonary/Chest: Effort normal and breath sounds normal.  Abdominal: Soft. Bowel sounds are normal.  Musculoskeletal:  Bilateral Lower Back-Tender to touch  Neurological: She is alert and oriented to person, place, and time.  Skin: Skin is warm and dry.  :   Monitoring Type:TOCO Time:Continuous FHR: Deferred UC: Irritability Noted, palpates moderate  Assessment IUP at 5737w5d Lower Back Pain Contractions  Plan Patient declines pain medication stating she is scared Fears addressed and safety, for infants, discussed Encouraged to treat pain when it is minimal and not when out of control-patient agrees and accepts Will order and consider procardia 10mg  Q6 hrs prn for palpated contractions Continue current plan of care  Cherre RobinsJessica  L Bristyn Kulesza, MSN, CNM 08/01/2015, 10:10 PM    Addendum (0115) S: Nurse reports patient without pain.  In room to assess.  Patient sleeping, did not attempt to awaken. O: TOCO: No contractions graphed A: Pain Resolved Contractions Resolved P: Continue mgmt as ordered  Cherre RobinsJessica L Lamont Glasscock MSN, CNM 08/02/2015 1:26 AM

## 2015-08-01 NOTE — H&P (Signed)
Teresa Farley is a 30 y.o. female, G4 P1021 at 31.6 weeks  Patient Active Problem List   Diagnosis Date Noted  . Twins 03/20/2015  . Vaginal bleeding in pregnancy 03/20/2015  . Condyloma 02/23/2011    Pregnancy Course: Patient entered care at 7.0 weeks.   EDC of 09/27/15 was established by [place].      [place] evaluations:   8.4 weeks - Dating: Di/Di twins, anteverted uterus  13.3 weeks minimal SCH, A fhr 161, B fhr 168      20.0 weeks - Anatomy  A, cervix 4.36,  efw 15oz - 97%,  fhr 153, vertex, anterior placenta,                    B efw 14oz -  88.5%, fhr 158  29.2 weeks - FU A - vertex anterior placenta, EFW 4lbs 0z,                                         B breech, anterior placenta, EFW 3lb 12oz  Significant prenatal events:   none   Last evaluation:   31.2 weeks     Reason for admission:  R/O kidney labs  Pt States:   Contractions Frequency: 2         Contraction severity: strong         Fetal activity: +FM  OB History    Gravida Para Term Preterm AB TAB SAB Ectopic Multiple Living   0 0 0 1     Past Medical History  Diagnosis Date  . Anemia   . H/O varicella   . Irregular periods/menstrual cycles   . Abnormal Pap smear 2012  . Low iron   . Condyloma 02/23/2011    Patient states that she is receiving [facility] from [facility]    Past Surgical History  Procedure Laterality Date  . Dilation and curettage of uterus  june 2012   Family History: family history includes Breast cancer in her maternal grandmother; Cancer in her maternal grandmother. Social History:  reports that she has quit smoking. Her smoking use included Cigarettes. She has a 2.5 pack-year smoking history. She has never used smokeless tobacco. She reports that she does not drink alcohol or use illicit drugs.   Prenatal Transfer Tool  Maternal Diabetes: No Genetic Screening: Normal Maternal Ultrasounds/Referrals: Normal Fetal Ultrasounds or other Referrals:   None Maternal Substance Abuse:  No Significant Maternal Medications:  None Significant Maternal Lab Results: None   ROS:  See HPI above, all other systems are negative  No Known Allergies  Dilation: Closed Exam by:: Dr Teresa Farley Blood pressure 124/68, last menstrual period 09/27/2014.  Maternal Exam:  Uterine Assessment: Contraction frequency is rare.  Abdomen: Gravid, non tender. Fundal height is aga.  Normal external genitalia, vulva, cervix, uterus and adnexa.  No lesions noted on exam.  Pelvis adequate for delivery.  Fetal presentation: vertex/vertec  Fetal Exam:  Monitor Surveillance : Continuous Monitoring Mode: Ultrasound.  NICHD: Category 1 x2 CTXs: Q by palpation EFW   A - 3lb 12oz, B - 4lb 0oz   Physical Exam: Nursing note and vitals reviewed General: alert and cooperative She appears well nourished Psychiatric: Normal mood and affect. Her behavior is normal Head: Normocephalic Eyes: Pupils are equal, round, and reactive to light Neck: Normal range of motion Cardiovascular: RRR without  murmur  Respiratory: CTAB. Effort normal  Abd: soft, non-tender, +BS, no rebound, no guarding  Genitourinary: Vagina normal  Neurological: A&Ox3 Skin: Warm and dry  Musculoskeletal: Normal range of motion  Homan's sign negative bilaterally No evidence of DVTs.  Edema:Minimal bilaterally non-pitting edema DTR: 2+ Clonus: None   Prenatal labs: ABO, Rh:  B positive Antibody:  neg Rubella:  immune RPR:   NR HBsAg:   neg HIV:   NR GBS:  unknown Sickle cell/Hgb electrophoresis:  WNL Pap:  ASCUS with HPV+ 06/16/14 GC:   neg Chlamydia: neg Genetic screenings:   Glucola:  neg   Assessment:  IUP at 31.5 weeks NICHD: Category 1 x2 Membranes: intact GBS unknown R/O kidney stones   Plan:  Admit to Antepartum unit Routing [facility] AP orders IV pain medication per orders Prophylaxis abx if necessary Diet: regular Ambien 10mg  po hs prn for sleep   [name], CNM, MSN 08/01/2015, 12:38 PM    Patient was seen and examined in the maternity admission unit. She is complaining of left flank pain that radiates to her lower abdomen. The pain began this morning at 6 am it is constant and has gotten progressively worse. She reports fetal movement. She denies contractions or vaginal bleeding. She reports the pain is severe 10 out 10.   On exam she is tender to palpation over her left flank and Left lower quadrant.  Cervix closed Thick  High .. No vaginal bleeding noted.   Per CNM venus standard preliminary ultrasound does not show any evidence of abruption.   FHR Category 1 x 2.. Toco no contractions noted.   A/P 31 wks and 5 days with sever left flank pain and left lower quadrant pain.  Highly suspicious for kidney stone- will admit to Antenatal unit for observation. IV fluids and pain control  Renal U/S ordered  IV fluid bolus and then run at 125 CC IV Stadol for pain initially  Then switch to percocet prn Check urinalysis  Strain all urine

## 2015-08-01 NOTE — Progress Notes (Signed)
PC received from CNM and fetal monitoring orders changed and pt replaced back on the monitors.

## 2015-08-01 NOTE — MAU Note (Signed)
Patient brought back to room 10 via wheelchair, called Venus Standard CNM ([facility]) to come evaluate patient in room 10 as patient is in extreme pain.

## 2015-08-01 NOTE — MAU Note (Signed)
[facility] room assignment of [id number] after patient has renal [place]

## 2015-08-02 LAB — CULTURE, OB URINE

## 2015-08-02 MED ORDER — ONDANSETRON HCL 4 MG/2ML IJ SOLN
4.0000 mg | Freq: Once | INTRAMUSCULAR | Status: AC
  Administered 2015-08-02: 4 mg via INTRAVENOUS
  Filled 2015-08-02: qty 2

## 2015-08-02 MED ORDER — DOCUSATE SODIUM 100 MG PO CAPS: 100.0000 mg | ORAL_CAPSULE | Freq: Two times a day (BID) | ORAL | Status: DC | PRN

## 2015-08-02 MED ORDER — CYCLOBENZAPRINE HCL 10 MG PO TABS: 10.0000 mg | ORAL_TABLET | Freq: Three times a day (TID) | ORAL | Status: DC | PRN

## 2015-08-02 MED ORDER — OXYCODONE-ACETAMINOPHEN 5-325 MG PO TABS: 1.0000 | ORAL_TABLET | ORAL | Status: DC | PRN

## 2015-08-02 MED ORDER — NIFEDIPINE 10 MG PO CAPS: 10.0000 mg | ORAL_CAPSULE | Freq: Four times a day (QID) | ORAL | Status: DC | PRN

## 2015-08-02 NOTE — Progress Notes (Signed)
Pt sleeping. 

## 2015-08-02 NOTE — Discharge Summary (Signed)
Physician Discharge Summary  Patient ID: Teresa Farley MRN: [id number] DOB/AGE: [birth year] 30 y.o.  Admit date: 08/01/2015 Discharge date: 08/02/2015  Admission Diagnoses:  Discharge Diagnoses:  Active Problems:   Twins   Discharged Condition: good  Hospital Course: observation  PreNatal Labs ABO, Rh: --/--/B POS (03/04 1136)   Antibody: NEG (03/04 1136) Rubella:  immune RPR:   NR HBsAg:   neg HIV:   NR GBS:  unknown Sickle cell/Hgb electrophoresis: WNL Pap: ASCUS with HPV+ 06/16/14 GC: neg Chlamydia: neg Genetic screenings:  Glucola: neg    Consults: None  Significant Diagnostic Studies: labs:  Recent Results (from the past 2160 hour(s))  Urinalysis, Routine w reflex microscopic (not at [facility])     Status: None   Collection Time: 06/01/15  9:00 AM  Result Value Ref Range   Color, Urine YELLOW YELLOW   APPearance CLEAR CLEAR   Specific Gravity, Urine 1.020 1.005 - 1.030   pH 7.5 5.0 - 8.0   Glucose, UA NEGATIVE NEGATIVE mg/dL   Hgb urine dipstick NEGATIVE NEGATIVE   Bilirubin Urine NEGATIVE NEGATIVE   Ketones, ur NEGATIVE NEGATIVE mg/dL   Protein, ur NEGATIVE NEGATIVE mg/dL   Nitrite NEGATIVE NEGATIVE   Leukocytes, UA NEGATIVE NEGATIVE    Comment: MICROSCOPIC NOT DONE ON URINES WITH NEGATIVE PROTEIN, BLOOD, LEUKOCYTES, NITRITE, OR GLUCOSE <1000 mg/dL.  CBC     Status: Abnormal   Collection Time: 06/01/15  9:12 AM  Result Value Ref Range   WBC 7.0 4.0 - 10.5 K/uL   RBC 3.36 (L) 3.87 - 5.11 MIL/uL   Hemoglobin 10.6 (L) 12.0 - 15.0 g/dL   HCT 30.8 (L) 36.0 - 46.0 %   MCV 91.7 78.0 - 100.0 fL   MCH 31.5 26.0 - 34.0 pg   MCHC 34.4 30.0 - 36.0 g/dL   RDW 13.6 11.5 - 15.5 %   Platelets 139 (L) 150 - 400 K/uL  Amylase     Status: None   Collection Time: 06/01/15  9:15 AM  Result Value Ref Range   Amylase 54 28 - 100 U/L  Comprehensive metabolic panel     Status: Abnormal   Collection Time: 06/01/15  9:15 AM  Result Value Ref Range   Sodium 138 135 -  145 mmol/L   Potassium 3.6 3.5 - 5.1 mmol/L   Chloride 108 101 - 111 mmol/L   CO2 21 (L) 22 - 32 mmol/L   Glucose, Bld 75 65 - 99 mg/dL   BUN 7 6 - 20 mg/dL   Creatinine, Ser 0.43 (L) 0.44 - 1.00 mg/dL   Calcium 8.6 (L) 8.9 - 10.3 mg/dL   Total Protein 6.7 6.5 - 8.1 g/dL   Albumin 3.1 (L) 3.5 - 5.0 g/dL   AST 19 15 - 41 U/L   ALT 16 14 - 54 U/L   Alkaline Phosphatase 58 38 - 126 U/L   Total Bilirubin 0.4 0.3 - 1.2 mg/dL   GFR calc non Af Amer >60 >60 mL/min   GFR calc Af Amer >60 >60 mL/min    Comment: (NOTE) The eGFR has been calculated using the CKD EPI equation. This calculation has not been validated in all clinical situations. eGFR's persistently <60 mL/min signify possible Chronic Kidney Disease.    Anion gap 9 5 - 15  Lipase, blood     Status: None   Collection Time: 06/01/15  9:15 AM  Result Value Ref Range   Lipase 32 11 - 51 U/L  Urinalysis, Routine w reflex microscopic (  not at [facility])     Status: Abnormal   Collection Time: 07/07/15  9:47 AM  Result Value Ref Range   Color, Urine YELLOW YELLOW   APPearance CLEAR CLEAR   Specific Gravity, Urine 1.020 1.005 - 1.030   pH 6.5 5.0 - 8.0   Glucose, UA NEGATIVE NEGATIVE mg/dL   Hgb urine dipstick NEGATIVE NEGATIVE   Bilirubin Urine NEGATIVE NEGATIVE   Ketones, ur 15 (A) NEGATIVE mg/dL   Protein, ur 30 (A) NEGATIVE mg/dL   Nitrite NEGATIVE NEGATIVE   Leukocytes, UA NEGATIVE NEGATIVE  Urine microscopic-add on     Status: Abnormal   Collection Time: 07/07/15  9:47 AM  Result Value Ref Range   Squamous Epithelial / LPF 0-5 (A) NONE SEEN   WBC, UA 0-5 0 - 5 WBC/hpf   RBC / HPF NONE SEEN 0 - 5 RBC/hpf   Bacteria, UA FEW (A) NONE SEEN  CBC with Differential/Platelet     Status: Abnormal   Collection Time: 07/07/15  9:48 AM  Result Value Ref Range   WBC 8.5 4.0 - 10.5 K/uL   RBC 3.43 (L) 3.87 - 5.11 MIL/uL   Hemoglobin 10.5 (L) 12.0 - 15.0 g/dL   HCT 31.1 (L) 36.0 - 46.0 %   MCV 90.7 78.0 - 100.0 fL   MCH 30.6 26.0 -  34.0 pg   MCHC 33.8 30.0 - 36.0 g/dL   RDW 13.4 11.5 - 15.5 %   Platelets 142 (L) 150 - 400 K/uL   Neutrophils Relative % 74 %   Neutro Abs 6.3 1.7 - 7.7 K/uL   Lymphocytes Relative 20 %   Lymphs Abs 1.7 0.7 - 4.0 K/uL   Monocytes Relative 5 %   Monocytes Absolute 0.4 0.1 - 1.0 K/uL   Eosinophils Relative 1 %   Eosinophils Absolute 0.1 0.0 - 0.7 K/uL   Basophils Relative 0 %   Basophils Absolute 0.0 0.0 - 0.1 K/uL  Comprehensive metabolic panel     Status: Abnormal   Collection Time: 07/07/15  9:48 AM  Result Value Ref Range   Sodium 134 (L) 135 - 145 mmol/L   Potassium 4.2 3.5 - 5.1 mmol/L   Chloride 105 101 - 111 mmol/L   CO2 23 22 - 32 mmol/L   Glucose, Bld 93 65 - 99 mg/dL   BUN 6 6 - 20 mg/dL   Creatinine, Ser 0.44 0.44 - 1.00 mg/dL   Calcium 8.5 (L) 8.9 - 10.3 mg/dL   Total Protein 6.9 6.5 - 8.1 g/dL   Albumin 3.1 (L) 3.5 - 5.0 g/dL   AST 20 15 - 41 U/L   ALT 19 14 - 54 U/L   Alkaline Phosphatase 84 38 - 126 U/L   Total Bilirubin 0.6 0.3 - 1.2 mg/dL   GFR calc non Af Amer >60 >60 mL/min   GFR calc Af Amer >60 >60 mL/min    Comment: (NOTE) The eGFR has been calculated using the CKD EPI equation. This calculation has not been validated in all clinical situations. eGFR's persistently <60 mL/min signify possible Chronic Kidney Disease.    Anion gap 6 5 - 15  Amylase     Status: None   Collection Time: 07/07/15  9:48 AM  Result Value Ref Range   Amylase 52 28 - 100 U/L  Lipase, blood     Status: None   Collection Time: 07/07/15  9:48 AM  Result Value Ref Range   Lipase 23 11 - 51 U/L  Type and  screen [place]     Status: None   Collection Time: 08/01/15 11:36 AM  Result Value Ref Range   ABO/RH(D) B POS    Antibody Screen NEG    Sample Expiration 08/04/2015   CBC with Differential/Platelet     Status: Abnormal   Collection Time: 08/01/15 12:23 PM  Result Value Ref Range   WBC 7.0 4.0 - 10.5 K/uL   RBC 3.41 (L) 3.87 - 5.11 MIL/uL    Hemoglobin 10.1 (L) 12.0 - 15.0 g/dL   HCT 30.3 (L) 36.0 - 46.0 %   MCV 88.9 78.0 - 100.0 fL   MCH 29.6 26.0 - 34.0 pg   MCHC 33.3 30.0 - 36.0 g/dL   RDW 13.7 11.5 - 15.5 %   Platelets 125 (L) 150 - 400 K/uL   Neutrophils Relative % 76 %   Neutro Abs 5.3 1.7 - 7.7 K/uL   Lymphocytes Relative 20 %   Lymphs Abs 1.4 0.7 - 4.0 K/uL   Monocytes Relative 3 %   Monocytes Absolute 0.2 0.1 - 1.0 K/uL   Eosinophils Relative 1 %   Eosinophils Absolute 0.1 0.0 - 0.7 K/uL   Basophils Relative 0 %   Basophils Absolute 0.0 0.0 - 0.1 K/uL  Kleihauer-Betke stain     Status: None   Collection Time: 08/01/15 12:23 PM  Result Value Ref Range   Fetal Cells % 0 %   Quantitation Fetal Hemoglobin 0 mL   # Vials RhIg 1   DIC (disseminated intravasc coag) panel     Status: Abnormal   Collection Time: 08/01/15 12:23 PM  Result Value Ref Range   Prothrombin Time 14.4 11.6 - 15.2 seconds   INR 1.11 0.00 - 1.49   aPTT 24 24 - 37 seconds   Fibrinogen 425 204 - 475 mg/dL   D-Dimer, Quant 1.45 (H) 0.00 - 0.50 ug/mL-FEU    Comment: (NOTE) At the manufacturer cut-off of 0.50 ug/mL FEU, this assay has been documented to exclude PE with a sensitivity and negative predictive value of 97 to 99%.  At this time, this assay has not been approved by the FDA to exclude DVT/VTE. Results should be correlated with clinical presentation.    Platelets 130 (L) 150 - 400 K/uL   Smear Review NO SCHISTOCYTES SEEN     Treatments: IV hydration and analgesia: Morphine and stadol  Discharge Exam: Blood pressure 115/62, pulse 86, temperature 98.2 F (36.8 C), temperature source Oral, resp. rate 16, height 5' 7"  (1.702 m), weight 224 lb (101.606 kg), last menstrual period 09/27/2014. General appearance: alert and cooperative FHR Category 1 x2, no ctx, some irritability noted  Disposition: 01-Home or Self Care Next office visit 08/10/15    Medication List    ASK your doctor about these medications         Doxylamine-Pyridoxine 10-10 MG Tbec  Commonly known as:  DICLEGIS  Take 2 tablets by mouth at bedtime as needed.     ondansetron 4 MG tablet  Commonly known as:  ZOFRAN  Take 4 mg by mouth every 8 (eight) hours as needed for nausea or vomiting.     OVER THE COUNTER MEDICATION  Take 1 tablet by mouth 2 (two) times daily. Patient takes over the counter iron tablet twice a day but not sure of the mg.     pantoprazole 20 MG tablet  Commonly known as:  PROTONIX  Take 1 tablet (20 mg total) by mouth daily.     prenatal  multivitamin Tabs tablet  Take 1 tablet by mouth daily at 12 noon.         Signed: Hansford Hirt, CNM, MSN 08/02/2015, 11:09 AM

## 2015-08-02 NOTE — Discharge Instructions (Signed)
Fetal Movement Counts °Patient Name: __________________________________________________ Patient Due Date: ____________________ °Performing a fetal movement count is highly recommended in high-risk pregnancies, but it is good for every pregnant woman to do. Your health care provider may ask you to start counting fetal movements at 28 weeks of the pregnancy. Fetal movements often increase: °· After eating a full meal. °· After physical activity. °· After eating or drinking something sweet or cold. °· At rest. °Pay attention to when you feel the baby is most active. This will help you notice a pattern of your baby's sleep and wake cycles and what factors contribute to an increase in fetal movement. It is important to perform a fetal movement count at the same time each day when your baby is normally most active.  °HOW TO COUNT FETAL MOVEMENTS °1. Find a quiet and comfortable area to sit or lie down on your left side. Lying on your left side provides the best blood and oxygen circulation to your baby. °2. Write down the day and time on a sheet of paper or in a journal. °3. Start counting kicks, flutters, swishes, rolls, or jabs in a 2-hour period. You should feel at least 10 movements within 2 hours. °4. If you do not feel 10 movements in 2 hours, wait 2-3 hours and count again. Look for a change in the pattern or not enough counts in 2 hours. °SEEK MEDICAL CARE IF: °· You feel less than 10 counts in 2 hours, tried twice. °· There is no movement in over an hour. °· The pattern is changing or taking longer each day to reach 10 counts in 2 hours. °· You feel the baby is not moving as he or she usually does. °Date: ____________ Movements: ____________ Start time: ____________ Finish time: ____________  °Date: ____________ Movements: ____________ Start time: ____________ Finish time: ____________ °Date: ____________ Movements: ____________ Start time: ____________ Finish time: ____________ °Date: ____________ Movements:  ____________ Start time: ____________ Finish time: ____________ °Date: ____________ Movements: ____________ Start time: ____________ Finish time: ____________ °Date: ____________ Movements: ____________ Start time: ____________ Finish time: ____________ °Date: ____________ Movements: ____________ Start time: ____________ Finish time: ____________ °Date: ____________ Movements: ____________ Start time: ____________ Finish time: ____________  °Date: ____________ Movements: ____________ Start time: ____________ Finish time: ____________ °Date: ____________ Movements: ____________ Start time: ____________ Finish time: ____________ °Date: ____________ Movements: ____________ Start time: ____________ Finish time: ____________ °Date: ____________ Movements: ____________ Start time: ____________ Finish time: ____________ °Date: ____________ Movements: ____________ Start time: ____________ Finish time: ____________ °Date: ____________ Movements: ____________ Start time: ____________ Finish time: ____________ °Date: ____________ Movements: ____________ Start time: ____________ Finish time: ____________  °Date: ____________ Movements: ____________ Start time: ____________ Finish time: ____________ °Date: ____________ Movements: ____________ Start time: ____________ Finish time: ____________ °Date: ____________ Movements: ____________ Start time: ____________ Finish time: ____________ °Date: ____________ Movements: ____________ Start time: ____________ Finish time: ____________ °Date: ____________ Movements: ____________ Start time: ____________ Finish time: ____________ °Date: ____________ Movements: ____________ Start time: ____________ Finish time: ____________ °Date: ____________ Movements: ____________ Start time: ____________ Finish time: ____________  °Date: ____________ Movements: ____________ Start time: ____________ Finish time: ____________ °Date: ____________ Movements: ____________ Start time: ____________ Finish  time: ____________ °Date: ____________ Movements: ____________ Start time: ____________ Finish time: ____________ °Date: ____________ Movements: ____________ Start time: ____________ Finish time: ____________ °Date: ____________ Movements: ____________ Start time: ____________ Finish time: ____________ °Date: ____________ Movements: ____________ Start time: ____________ Finish time: ____________ °Date: ____________ Movements: ____________ Start time: ____________ Finish time: ____________  °Date: ____________ Movements: ____________ Start time: ____________ Finish   time: ____________ Date: ____________ Movements: ____________ Start time: ____________ Teresa MartinFinish time: ____________ Date: ____________ Movements: ____________ Start time: ____________ Teresa MartinFinish time: ____________ Date: ____________ Movements: ____________ Start time: ____________ Teresa MartinFinish time: ____________ Date: ____________ Movements: ____________ Start time: ____________ Teresa MartinFinish time: ____________ Date: ____________ Movements: ____________ Start time: ____________ Teresa MartinFinish time: ____________ Date: ____________ Movements: ____________ Start time: ____________ Teresa MartinFinish time: ____________  Date: ____________ Movements: ____________ Start time: ____________ Teresa MartinFinish time: ____________ Date: ____________ Movements: ____________ Start time: ____________ Teresa MartinFinish time: ____________ Date: ____________ Movements: ____________ Start time: ____________ Teresa MartinFinish time: ____________ Date: ____________ Movements: ____________ Start time: ____________ Teresa MartinFinish time: ____________ Date: ____________ Movements: ____________ Start time: ____________ Teresa MartinFinish time: ____________ Date: ____________ Movements: ____________ Start time: ____________ Teresa MartinFinish time: ____________ Date: ____________ Movements: ____________ Start time: ____________ Teresa MartinFinish time: ____________  Date: ____________ Movements: ____________ Start time: ____________ Teresa MartinFinish time: ____________ Date: ____________  Movements: ____________ Start time: ____________ Teresa MartinFinish time: ____________ Date: ____________ Movements: ____________ Start time: ____________ Teresa MartinFinish time: ____________ Date: ____________ Movements: ____________ Start time: ____________ Teresa MartinFinish time: ____________ Date: ____________ Movements: ____________ Start time: ____________ Teresa MartinFinish time: ____________ Date: ____________ Movements: ____________ Start time: ____________ Teresa MartinFinish time: ____________ Date: ____________ Movements: ____________ Start time: ____________ Teresa MartinFinish time: ____________  Date: ____________ Movements: ____________ Start time: ____________ Teresa MartinFinish time: ____________ Date: ____________ Movements: ____________ Start time: ____________ Teresa MartinFinish time: ____________ Date: ____________ Movements: ____________ Start time: ____________ Teresa MartinFinish time: ____________ Date: ____________ Movements: ____________ Start time: ____________ Teresa MartinFinish time: ____________ Date: ____________ Movements: ____________ Start time: ____________ Teresa MartinFinish time: ____________ Date: ____________ Movements: ____________ Start time: ____________ Teresa MartinFinish time: ____________   This information is not intended to replace advice given to you by your health care provider. Make sure you discuss any questions you have with your health care provider.   Document Released: 06/15/2006 Document Revised: 06/06/2014 Document Reviewed: 03/12/2012 Elsevier Interactive Patient Education 2016 [employer]      Preterm Labor Information Preterm labor is when labor starts before you are [redacted] weeks pregnant. The normal length of pregnancy is 39 to 41 weeks.  CAUSES  The cause of preterm labor is not often known. The most common known cause is infection. RISK FACTORS  Having a history of preterm labor.  Having your water break before it should.  Having a placenta that covers the opening of the cervix.  Having a placenta that breaks away from the uterus.  Having a cervix that is too  weak to hold the baby in the uterus.  Having too much fluid in the amniotic sac.  Taking drugs or smoking while pregnant.  Not gaining enough weight while pregnant.  Being younger than [address]18 and older than 30 years old.  Having a low income.  Being African American. SYMPTOMS 5. Period-like cramps, belly (abdominal) pain, or back pain. 6. Contractions that are regular, as often as six in an hour. They may be mild or painful. 7. Contractions that start at the top of the belly. They then move to the lower belly and back. 8. Lower belly pressure that seems to get stronger. 9. Bleeding from the vagina. 10. Fluid leaking from the vagina. TREATMENT  Treatment depends on:  Your condition.  The condition of your baby.  How many weeks pregnant you are. Your doctor may have you:  Take medicine to stop contractions.  Stay in bed except to use the restroom (bed rest).  Stay in the hospital. WHAT SHOULD YOU DO IF YOU THINK YOU ARE IN PRETERM LABOR? Call your doctor right away. You need to go to the hospital  right away.  HOW CAN YOU PREVENT PRETERM LABOR IN FUTURE PREGNANCIES?  Stop smoking, if you smoke.  Maintain healthy weight gain.  Do not take drugs or be around chemicals that are not needed.  Tell your doctor if you think you have an infection.  Tell your doctor if you had a preterm labor before.   This information is not intended to replace advice given to you by your health care provider. Make sure you discuss any questions you have with your health care provider.   Document Released: 08/12/2008 Document Revised: 09/30/2014 Document Reviewed: 06/18/2012 Elsevier Interactive Patient Education [employer]

## 2015-08-27 ENCOUNTER — Inpatient Hospital Stay (HOSPITAL_COMMUNITY)
Admission: AD | Admit: 2015-08-27 | Discharge: 2015-08-27 | Disposition: A | Discharge status: Home / Self Care | Payer: No Typology Code available for payment source | Source: Ambulatory Visit | Attending: Obstetrics and Gynecology | Admitting: Obstetrics and Gynecology

## 2015-08-27 ENCOUNTER — Encounter (HOSPITAL_COMMUNITY): Payer: Self-pay

## 2015-08-27 DIAGNOSIS — O30003 Twin pregnancy, unspecified number of placenta and unspecified number of amniotic sacs, third trimester: Secondary | ICD-10-CM | POA: Insufficient documentation

## 2015-08-27 DIAGNOSIS — IMO0001 Reserved for inherently not codable concepts without codable children: Secondary | ICD-10-CM

## 2015-08-27 DIAGNOSIS — Z3A35 35 weeks gestation of pregnancy: Secondary | ICD-10-CM | POA: Insufficient documentation

## 2015-08-27 DIAGNOSIS — Z87891 Personal history of nicotine dependence: Secondary | ICD-10-CM | POA: Diagnosis not present

## 2015-08-27 DIAGNOSIS — O26893 Other specified pregnancy related conditions, third trimester: Secondary | ICD-10-CM | POA: Insufficient documentation

## 2015-08-27 LAB — URINALYSIS, ROUTINE W REFLEX MICROSCOPIC
Bilirubin Urine: NEGATIVE
Glucose, UA: NEGATIVE mg/dL
Hgb urine dipstick: NEGATIVE
Ketones, ur: NEGATIVE mg/dL
LEUKOCYTES UA: NEGATIVE
Nitrite: NEGATIVE
PROTEIN: NEGATIVE mg/dL
Specific Gravity, Urine: 1.015 (ref 1.005–1.030)
pH: 6 (ref 5.0–8.0)

## 2015-08-27 NOTE — MAU Note (Addendum)
Pt c/o severe pelvic pressure starting 0200 this morning. Pt has been taking procardia at home for preterm labor. Pt took last dose of procardia at 0200. Pt states babies haven't been moving as much as normal this morning. Pt denies bleeding and leaking of fluid.

## 2015-08-27 NOTE — MAU Provider Note (Signed)
Teresa Farley is a 30 y.o. G4P1021 at 35.3 weeks with twins c/o increase pressure and a back ache.  Pt desire vaginal delivery if possible with BTL   History     Patient Active Problem List   Diagnosis Date Noted  . Twins 03/20/2015  . Vaginal bleeding in pregnancy 03/20/2015  . 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 Contractions   HPI  OB History    Gravida Para Term Preterm AB TAB SAB Ectopic Multiple Living   0 0 0 1      Past Medical History  Diagnosis Date  . Anemia   . H/O varicella   . Irregular periods/menstrual cycles   . Abnormal Pap smear 2012  . Low iron   . 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 Dilation and curettage of uterus  june 2012    Family History  Problem Relation Age of Onset  . Breast cancer Maternal Grandmother   . Cancer Maternal Grandmother     OVARIAN    Social History  Substance Use Topics  . Smoking status: Former Smoker -- 0.50 packs/day for 5 years    Types: Cigarettes  . Smokeless tobacco: Never Used  . Alcohol Use: No     Comment: occassionally  LAST TIME USED- JULY  1 ST    Allergies: No Known Allergies  Prescriptions prior to admission  Medication Sig Dispense Refill Last Dose  . cyclobenzaprine (FLEXERIL) 10 MG tablet Take 1 tablet (10 mg total) by mouth 3 (three) times daily as needed for muscle spasms. 30 tablet 0 08/26/2015 at Unknown time  . NIFEdipine (PROCARDIA) 10 MG capsule Take 1 capsule (10 mg total) by mouth every 6 (six) hours as needed (Contractions). 30 capsule 2 08/27/2015 at Unknown time  . ondansetron (ZOFRAN) 4 MG tablet Take 4 mg by mouth every 8 (eight) hours as needed for nausea or vomiting.   08/27/2015 at Unknown time  . Prenatal Vit-Fe Fumarate-FA (PRENATAL MULTIVITAMIN) TABS tablet Take 1 tablet by mouth daily at 12 noon.   08/27/2015  at Unknown time  . docusate sodium (COLACE) 100 MG capsule Take 1 capsule (100 mg total) by mouth 2 (two) times daily as needed for mild constipation. (Patient not taking: Reported on 08/27/2015) 10 capsule 0 Not Taking at Unknown time  . oxyCODONE-acetaminophen (PERCOCET/ROXICET) 5-325 MG tablet Take 1-2 tablets by mouth every 4 (four) hours as needed for severe pain. (Patient not taking: Reported on 08/27/2015) 30 tablet 0 Not Taking at Unknown time  . pantoprazole (PROTONIX) 20 MG tablet Take 1 tablet (20 mg total) by mouth daily. (Patient not taking: Reported on 08/27/2015) 30 tablet 4 Not Taking at Unknown time    ROS See HPI above, all other systems are negative  Physical Exam   Blood pressure 113/60, pulse 90, temperature 99 F (37.2 C), temperature source Oral, resp. rate 18, height  (1.702 m), weight 233 lb (105.688 kg), last menstrual period 09/27/2014, SpO2 98 %.  Physical Exam Ext:  WNL ABD: Soft, non tender to palpation, no rebound or guarding SVE: C/T/H  VE x2   ED Course  Assessment: IUP at  35.3 weeks Membranes: intact FHR: Category 1x2 CTX:  occasional   MAU Addendum Note  Results for orders placed or performed during the hospital encounter  of 08/27/15 (from the past 24 hour(s))  Urinalysis, Routine w reflex microscopic ([facility])     Status: None   Collection Time: 08/27/15 10:43 AM  Result Value Ref Range   Color, Urine YELLOW YELLOW   APPearance CLEAR CLEAR   Specific Gravity, Urine 1.015 1.005 - 1.030   pH 6.0 5.0 - 8.0   Glucose, UA NEGATIVE NEGATIVE mg/dL   Hgb urine dipstick NEGATIVE NEGATIVE   Bilirubin Urine NEGATIVE NEGATIVE   Ketones, ur NEGATIVE NEGATIVE mg/dL   Protein, ur NEGATIVE NEGATIVE mg/dL   Nitrite NEGATIVE NEGATIVE   Leukocytes, UA NEGATIVE NEGATIVE     Plan: -Con't Rx for flexeril -Discussed need to follow up in office on next ROB -Bleeding and PTL Precautions -Encouraged to call if any questions or concerns arise prior  to next scheduled office visit.  -Discharged to home in stable condition -Kick counts   Theodora Lalanne, CNM, MSN 08/27/2015. 1:22 PM

## 2015-08-27 NOTE — Discharge Instructions (Signed)
Fetal Movement Counts  Patient Name: __________________________________________________ Patient Due Date: ____________________  Performing a fetal movement count is highly recommended in high-risk pregnancies, but it is good for every pregnant woman to do. Your health care provider may ask you to start counting fetal movements at 28 weeks of the pregnancy. Fetal movements often increase:  · After eating a full meal.  · After physical activity.  · After eating or drinking something sweet or cold.  · At rest.  Pay attention to when you feel the baby is most active. This will help you notice a pattern of your baby's sleep and wake cycles and what factors contribute to an increase in fetal movement. It is important to perform a fetal movement count at the same time each day when your baby is normally most active.   HOW TO COUNT FETAL MOVEMENTS  1. Find a quiet and comfortable area to sit or lie down on your left side. Lying on your left side provides the best blood and oxygen circulation to your baby.  2. Write down the day and time on a sheet of paper or in a journal.  3. Start counting kicks, flutters, swishes, rolls, or jabs in a 2-hour period. You should feel at least 10 movements within 2 hours.  4. If you do not feel 10 movements in 2 hours, wait 2-3 hours and count again. Look for a change in the pattern or not enough counts in 2 hours.  SEEK MEDICAL CARE IF:  · You feel less than 10 counts in 2 hours, tried twice.  · There is no movement in over an hour.  · The pattern is changing or taking longer each day to reach 10 counts in 2 hours.  · You feel the baby is not moving as he or she usually does.  Date: ____________ Movements: ____________ Start time: ____________ Finish time: ____________   Date: ____________ Movements: ____________ Start time: ____________ Finish time: ____________  Date: ____________ Movements: ____________ Start time: ____________ Finish time: ____________  Date: ____________ Movements:  ____________ Start time: ____________ Finish time: ____________  Date: ____________ Movements: ____________ Start time: ____________ Finish time: ____________  Date: ____________ Movements: ____________ Start time: ____________ Finish time: ____________  Date: ____________ Movements: ____________ Start time: ____________ Finish time: ____________  Date: ____________ Movements: ____________ Start time: ____________ Finish time: ____________   Date: ____________ Movements: ____________ Start time: ____________ Finish time: ____________  Date: ____________ Movements: ____________ Start time: ____________ Finish time: ____________  Date: ____________ Movements: ____________ Start time: ____________ Finish time: ____________  Date: ____________ Movements: ____________ Start time: ____________ Finish time: ____________  Date: ____________ Movements: ____________ Start time: ____________ Finish time: ____________  Date: ____________ Movements: ____________ Start time: ____________ Finish time: ____________  Date: ____________ Movements: ____________ Start time: ____________ Finish time: ____________   Date: ____________ Movements: ____________ Start time: ____________ Finish time: ____________  Date: ____________ Movements: ____________ Start time: ____________ Finish time: ____________  Date: ____________ Movements: ____________ Start time: ____________ Finish time: ____________  Date: ____________ Movements: ____________ Start time: ____________ Finish time: ____________  Date: ____________ Movements: ____________ Start time: ____________ Finish time: ____________  Date: ____________ Movements: ____________ Start time: ____________ Finish time: ____________  Date: ____________ Movements: ____________ Start time: ____________ Finish time: ____________   Date: ____________ Movements: ____________ Start time: ____________ Finish time: ____________  Date: ____________ Movements: ____________ Start time: ____________ Finish  time: ____________  Date: ____________ Movements: ____________ Start time: ____________ Finish time: ____________  Date: ____________ Movements: ____________ Start time:   ____________ Finish time: ____________  Date: ____________ Movements: ____________ Start time: ____________ Finish time: ____________  Date: ____________ Movements: ____________ Start time: ____________ Finish time: ____________  Date: ____________ Movements: ____________ Start time: ____________ Finish time: ____________   Date: ____________ Movements: ____________ Start time: ____________ Finish time: ____________  Date: ____________ Movements: ____________ Start time: ____________ Finish time: ____________  Date: ____________ Movements: ____________ Start time: ____________ Finish time: ____________  Date: ____________ Movements: ____________ Start time: ____________ Finish time: ____________  Date: ____________ Movements: ____________ Start time: ____________ Finish time: ____________  Date: ____________ Movements: ____________ Start time: ____________ Finish time: ____________  Date: ____________ Movements: ____________ Start time: ____________ Finish time: ____________   Date: ____________ Movements: ____________ Start time: ____________ Finish time: ____________  Date: ____________ Movements: ____________ Start time: ____________ Finish time: ____________  Date: ____________ Movements: ____________ Start time: ____________ Finish time: ____________  Date: ____________ Movements: ____________ Start time: ____________ Finish time: ____________  Date: ____________ Movements: ____________ Start time: ____________ Finish time: ____________  Date: ____________ Movements: ____________ Start time: ____________ Finish time: ____________  Date: ____________ Movements: ____________ Start time: ____________ Finish time: ____________   Date: ____________ Movements: ____________ Start time: ____________ Finish time: ____________  Date: ____________  Movements: ____________ Start time: ____________ Finish time: ____________  Date: ____________ Movements: ____________ Start time: ____________ Finish time: ____________  Date: ____________ Movements: ____________ Start time: ____________ Finish time: ____________  Date: ____________ Movements: ____________ Start time: ____________ Finish time: ____________  Date: ____________ Movements: ____________ Start time: ____________ Finish time: ____________  Date: ____________ Movements: ____________ Start time: ____________ Finish time: ____________   Date: ____________ Movements: ____________ Start time: ____________ Finish time: ____________  Date: ____________ Movements: ____________ Start time: ____________ Finish time: ____________  Date: ____________ Movements: ____________ Start time: ____________ Finish time: ____________  Date: ____________ Movements: ____________ Start time: ____________ Finish time: ____________  Date: ____________ Movements: ____________ Start time: ____________ Finish time: ____________  Date: ____________ Movements: ____________ Start time: ____________ Finish time: ____________     This information is not intended to replace advice given to you by your health care provider. Make sure you discuss any questions you have with your health care provider.     Document Released: 06/15/2006 Document Revised: 06/06/2014 Document Reviewed: 03/12/2012  Elsevier Interactive Patient Education ©2016 [employer]

## 2015-09-01 LAB — OB RESULTS CONSOLE GBS: GBS: NEGATIVE

## 2015-09-06 ENCOUNTER — Inpatient Hospital Stay (HOSPITAL_COMMUNITY)
Admission: AD | Admit: 2015-09-06 | Discharge: 2015-09-06 | Disposition: A | Discharge status: Home / Self Care | Payer: Medicaid Other | Source: Ambulatory Visit | Attending: Obstetrics and Gynecology | Admitting: Obstetrics and Gynecology

## 2015-09-06 ENCOUNTER — Encounter (HOSPITAL_COMMUNITY): Payer: Self-pay

## 2015-09-06 DIAGNOSIS — O30003 Twin pregnancy, unspecified number of placenta and unspecified number of amniotic sacs, third trimester: Secondary | ICD-10-CM | POA: Insufficient documentation

## 2015-09-06 DIAGNOSIS — Z87891 Personal history of nicotine dependence: Secondary | ICD-10-CM | POA: Insufficient documentation

## 2015-09-06 DIAGNOSIS — IMO0001 Reserved for inherently not codable concepts without codable children: Secondary | ICD-10-CM

## 2015-09-06 DIAGNOSIS — Z3A37 37 weeks gestation of pregnancy: Secondary | ICD-10-CM | POA: Insufficient documentation

## 2015-09-06 DIAGNOSIS — O479 False labor, unspecified: Secondary | ICD-10-CM

## 2015-09-06 MED ORDER — NALBUPHINE HCL 10 MG/ML IJ SOLN
10.0000 mg | Freq: Once | INTRAMUSCULAR | Status: AC
  Administered 2015-09-06: 10 mg via INTRAVENOUS
  Filled 2015-09-06: qty 1

## 2015-09-06 MED ORDER — ONDANSETRON HCL 4 MG/2ML IJ SOLN
4.0000 mg | Freq: Once | INTRAMUSCULAR | Status: AC
  Administered 2015-09-06: 4 mg via INTRAVENOUS
  Filled 2015-09-06: qty 2

## 2015-09-06 MED ORDER — LACTATED RINGERS IV BOLUS (SEPSIS)
500.0000 mL | Freq: Once | INTRAVENOUS | Status: AC
  Administered 2015-09-06: 500 mL via INTRAVENOUS

## 2015-09-06 MED ORDER — LACTATED RINGERS IV SOLN
INTRAVENOUS | Status: DC
  Administered 2015-09-06: 04:00:00 via INTRAVENOUS

## 2015-09-06 NOTE — Discharge Instructions (Signed)
Braxton Hicks Contractions °Contractions of the uterus can occur throughout pregnancy. Contractions are not always a sign that you are in labor.  °WHAT ARE BRAXTON HICKS CONTRACTIONS?  °Contractions that occur before labor are called Braxton Hicks contractions, or false labor. Toward the end of pregnancy (32-34 weeks), these contractions can develop more often and may become more forceful. This is not true labor because these contractions do not result in opening (dilatation) and thinning of the cervix. They are sometimes difficult to tell apart from true labor because these contractions can be forceful and people have different pain tolerances. You should not feel embarrassed if you go to the hospital with false labor. Sometimes, the only way to tell if you are in true labor is for your health care provider to look for changes in the cervix. °If there are no prenatal problems or other health problems associated with the pregnancy, it is completely safe to be sent home with false labor and await the onset of true labor. °HOW CAN YOU TELL THE DIFFERENCE BETWEEN TRUE AND FALSE LABOR? °False Labor °· The contractions of false labor are usually shorter and not as hard as those of true labor.   °· The contractions are usually irregular.   °· The contractions are often felt in the front of the lower abdomen and in the groin.   °· The contractions may go away when you walk around or change positions while lying down.   °· The contractions get weaker and are shorter lasting as time goes on.   °· The contractions do not usually become progressively stronger, regular, and closer together as with true labor.   °True Labor °· Contractions in true labor last 30-70 seconds, become very regular, usually become more intense, and increase in frequency.   °· The contractions do not go away with walking.   °· The discomfort is usually felt in the top of the uterus and spreads to the lower abdomen and low back.   °· True labor can be  determined by your health care provider with an exam. This will show that the cervix is dilating and getting thinner.   °WHAT TO REMEMBER °· Keep up with your usual exercises and follow other instructions given by your health care provider.   °· Take medicines as directed by your health care provider.   °· Keep your regular prenatal appointments.   °· Eat and drink lightly if you think you are going into labor.   °· If Braxton Hicks contractions are making you uncomfortable:   °¨ Change your position from lying down or resting to walking, or from walking to resting.   °¨ Sit and rest in a tub of warm water.   °¨ Drink 2-3 glasses of water. Dehydration may cause these contractions.   °¨ Do slow and deep breathing several times an hour.   °WHEN SHOULD I SEEK IMMEDIATE MEDICAL CARE? °Seek immediate medical care if: °· Your contractions become stronger, more regular, and closer together.   °· You have fluid leaking or gushing from your vagina.   °· You have a fever.   °· You pass blood-tinged mucus.   °· You have vaginal bleeding.   °· You have continuous abdominal pain.   °· You have low back pain that you never had before.   °· You feel your baby's head pushing down and causing pelvic pressure.   °· Your baby is not moving as much as it used to.   °  °This information is not intended to replace advice given to you by your health care provider. Make sure you discuss any questions you have with your health care   provider. °  °Document Released: 05/16/2005 Document Revised: 05/21/2013 Document Reviewed: 02/25/2013 °[employer]vier Interactive Patient Education ©2016 [employer] °Fetal Movement Counts °Patient Name: __________________________________________________ Patient Due Date: ____________________ °Performing a fetal movement count is highly recommended in high-risk pregnancies, but it is good for every pregnant woman to do. Your health care provider may ask you to start counting fetal movements at 28 weeks of the  pregnancy. Fetal movements often increase: °· After eating a full meal. °· After physical activity. °· After eating or drinking something sweet or cold. °· At rest. °Pay attention to when you feel the baby is most active. This will help you notice a pattern of your baby's sleep and wake cycles and what factors contribute to an increase in fetal movement. It is important to perform a fetal movement count at the same time each day when your baby is normally most active.  °HOW TO COUNT FETAL MOVEMENTS °· Find a quiet and comfortable area to sit or lie down on your left side. Lying on your left side provides the best blood and oxygen circulation to your baby. °· Write down the day and time on a sheet of paper or in a journal. °· Start counting kicks, flutters, swishes, rolls, or jabs in a 2-hour period. You should feel at least 10 movements within 2 hours. °· If you do not feel 10 movements in 2 hours, wait 2-3 hours and count again. Look for a change in the pattern or not enough counts in 2 hours. °SEEK MEDICAL CARE IF: °· You feel less than 10 counts in 2 hours, tried twice. °· There is no movement in over an hour. °· The pattern is changing or taking longer each day to reach 10 counts in 2 hours. °· You feel the baby is not moving as he or she usually does. °Date: ____________ Movements: ____________ Start time: ____________ Finish time: ____________  °Date: ____________ Movements: ____________ Start time: ____________ Finish time: ____________ °Date: ____________ Movements: ____________ Start time: ____________ Finish time: ____________ °Date: ____________ Movements: ____________ Start time: ____________ Finish time: ____________ °Date: ____________ Movements: ____________ Start time: ____________ Finish time: ____________ °Date: ____________ Movements: ____________ Start time: ____________ Finish time: ____________ °Date: ____________ Movements: ____________ Start time: ____________ Finish time: ____________ °Date:  ____________ Movements: ____________ Start time: ____________ Finish time: ____________  °Date: ____________ Movements: ____________ Start time: ____________ Finish time: ____________ °Date: ____________ Movements: ____________ Start time: ____________ Finish time: ____________ °Date: ____________ Movements: ____________ Start time: ____________ Finish time: ____________ °Date: ____________ Movements: ____________ Start time: ____________ Finish time: ____________ °Date: ____________ Movements: ____________ Start time: ____________ Finish time: ____________ °Date: ____________ Movements: ____________ Start time: ____________ Finish time: ____________ °Date: ____________ Movements: ____________ Start time: ____________ Finish time: ____________  °Date: ____________ Movements: ____________ Start time: ____________ Finish time: ____________ °Date: ____________ Movements: ____________ Start time: ____________ Finish time: ____________ °Date: ____________ Movements: ____________ Start time: ____________ Finish time: ____________ °Date: ____________ Movements: ____________ Start time: ____________ Finish time: ____________ °Date: ____________ Movements: ____________ Start time: ____________ Finish time: ____________ °Date: ____________ Movements: ____________ Start time: ____________ Finish time: ____________ °Date: ____________ Movements: ____________ Start time: ____________ Finish time: ____________  °Date: ____________ Movements: ____________ Start time: ____________ Finish time: ____________ °Date: ____________ Movements: ____________ Start time: ____________ Finish time: ____________ °Date: ____________ Movements: ____________ Start time: ____________ Finish time: ____________ °Date: ____________ Movements: ____________ Start time: ____________ Finish time: ____________ °Date: ____________ Movements: ____________ Start time: ____________ Finish time: ____________ °Date: ____________ Movements: ____________ Start  time: ____________ Finish time:   ____________ °Date: ____________ Movements: ____________ Start time: ____________ Finish time: ____________  °Date: ____________ Movements: ____________ Start time: ____________ Finish time: ____________ °Date: ____________ Movements: ____________ Start time: ____________ Finish time: ____________ °Date: ____________ Movements: ____________ Start time: ____________ Finish time: ____________ °Date: ____________ Movements: ____________ Start time: ____________ Finish time: ____________ °Date: ____________ Movements: ____________ Start time: ____________ Finish time: ____________ °Date: ____________ Movements: ____________ Start time: ____________ Finish time: ____________ °Date: ____________ Movements: ____________ Start time: ____________ Finish time: ____________  °Date: ____________ Movements: ____________ Start time: ____________ Finish time: ____________ °Date: ____________ Movements: ____________ Start time: ____________ Finish time: ____________ °Date: ____________ Movements: ____________ Start time: ____________ Finish time: ____________ °Date: ____________ Movements: ____________ Start time: ____________ Finish time: ____________ °Date: ____________ Movements: ____________ Start time: ____________ Finish time: ____________ °Date: ____________ Movements: ____________ Start time: ____________ Finish time: ____________ °Date: ____________ Movements: ____________ Start time: ____________ Finish time: ____________  °Date: ____________ Movements: ____________ Start time: ____________ Finish time: ____________ °Date: ____________ Movements: ____________ Start time: ____________ Finish time: ____________ °Date: ____________ Movements: ____________ Start time: ____________ Finish time: ____________ °Date: ____________ Movements: ____________ Start time: ____________ Finish time: ____________ °Date: ____________ Movements: ____________ Start time: ____________ Finish time:  ____________ °Date: ____________ Movements: ____________ Start time: ____________ Finish time: ____________ °Date: ____________ Movements: ____________ Start time: ____________ Finish time: ____________  °Date: ____________ Movements: ____________ Start time: ____________ Finish time: ____________ °Date: ____________ Movements: ____________ Start time: ____________ Finish time: ____________ °Date: ____________ Movements: ____________ Start time: ____________ Finish time: ____________ °Date: ____________ Movements: ____________ Start time: ____________ Finish time: ____________ °Date: ____________ Movements: ____________ Start time: ____________ Finish time: ____________ °Date: ____________ Movements: ____________ Start time: ____________ Finish time: ____________ °  °This information is not intended to replace advice given to you by your health care provider. Make sure you discuss any questions you have with your health care provider. °  °Document Released: 06/15/2006 Document Revised: 06/06/2014 Document Reviewed: 03/12/2012 °Elsevier Interactive Patient Education ©2016 [employer] ° °

## 2015-09-06 NOTE — MAU Provider Note (Signed)
History    Teresa Farley is a 29 with twin gestation at 37wks who presents, unannounced, for contractions.  Patient states contractions started one hour prior to arrival and are currently unbearable.  Patient denies LoF, VB, and endorses fetal movement.  Patient states twins was vtx, vtx on [place] completed on 4/4.  GBS negative and desires pain medication.   Patient Active Problem List   Diagnosis Date Noted  . Twins 03/20/2015  . Vaginal bleeding in pregnancy 03/20/2015  . 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 Contractions   HPI  OB History    Gravida Para Term Preterm AB TAB SAB Ectopic Multiple Living   0 0 0 1      Past Medical History  Diagnosis Date  . Anemia   . H/O varicella   . Irregular periods/menstrual cycles   . Abnormal Pap smear 2012  . Low iron   . 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 Dilation and curettage of uterus  june 2012    Family History  Problem Relation Age of Onset  . Breast cancer Maternal Grandmother   . Cancer Maternal Grandmother     OVARIAN    Social History  Substance Use Topics  . Smoking status: Former Smoker -- 0.50 packs/day for 5 years    Types: Cigarettes  . Smokeless tobacco: Never Used  . Alcohol Use: No     Comment: occassionally  LAST TIME USED- JULY  1 ST    Allergies: No Known Allergies  Prescriptions prior to admission  Medication Sig Dispense Refill Last Dose  . cyclobenzaprine (FLEXERIL) 10 MG tablet Take 1 tablet (10 mg total) by mouth 3 (three) times daily as needed for muscle spasms. 30 tablet 0 08/26/2015 at Unknown time  . docusate sodium (COLACE) 100 MG capsule Take 1 capsule (100 mg total) by mouth 2 (two) times daily as needed for mild constipation. (Patient not taking: Reported on 08/27/2015) 10 capsule 0 Not Taking at Unknown time   . NIFEdipine (PROCARDIA) 10 MG capsule Take 1 capsule (10 mg total) by mouth every 6 (six) hours as needed (Contractions). 30 capsule 2 08/27/2015 at Unknown time  . ondansetron (ZOFRAN) 4 MG tablet Take 4 mg by mouth every 8 (eight) hours as needed for nausea or vomiting.   08/27/2015 at Unknown time  . oxyCODONE-acetaminophen (PERCOCET/ROXICET) 5-325 MG tablet Take 1-2 tablets by mouth every 4 (four) hours as needed for severe pain. (Patient not taking: Reported on 08/27/2015) 30 tablet 0 Not Taking at Unknown time  . pantoprazole (PROTONIX) 20 MG tablet Take 1 tablet (20 mg total) by mouth daily. (Patient not taking: Reported on 08/27/2015) 30 tablet 4 Not Taking at Unknown time  . Prenatal Vit-Fe Fumarate-FA (PRENATAL MULTIVITAMIN) TABS tablet Take 1 tablet by mouth daily at 12 noon.   08/27/2015 at Unknown time    ROS  See HPI Above Physical Exam   Height  (1.702 m), weight 105.416 kg (232 lb 6.4 oz), last menstrual period 09/27/2014.  No results found for this or any previous visit (from the past 24 hour(s)).  Physical Exam  Vitals reviewed. Constitutional: She is oriented to person, place, and time. She appears well-developed and well-nourished. She appears distressed (Mild with Ctx).  HENT:  Head: Normocephalic and atraumatic.  Eyes:  Conjunctivae are normal.  Neck: Normal range of motion.  Cardiovascular: Normal rate, regular rhythm and normal heart sounds.   Respiratory: Effort normal and breath sounds normal.  GI: Soft. Bowel sounds are normal.  Musculoskeletal: Normal range of motion. She exhibits edema (+2 in BLE).  Neurological: She is alert and oriented to person, place, and time.  Skin: Skin is warm and dry.  Psychiatric: She has a normal mood and affect. Her behavior is normal.     FHR:125 bpm, Mod Var, -Decels, +Accels FHRB: 120 bpm, Mod Var, -Decels, +Accels UC: Q5-136min, palpates mild ED Course  Assessment: TIUP at 37wks Pressure Contractions  Plan: -VE  per nurse 1-2 -Start IV with LR bolus f/b infusion -Draw and hold admission labs -Give IV pain medication; nubain 10mg  -Continued monitoring -Will reassess in 2 hrs  Follow Up ([id number]) -VE remains the same -Ctx now irregular -Patient reports improved comfort s/p  -No q/c -Will discharge to home with labor precautions -Encouraged to call if any questions or concerns arise prior to next scheduled office visit.   Cherre RobinsJessica L Oseias Horsey CNM, MSN 09/06/2015 3:27 AM

## 2015-09-06 NOTE — MAU Note (Signed)
Contractions since 2130 and getting stronger. Denies LOF or bleeding

## 2015-09-09 ENCOUNTER — Other Ambulatory Visit: Payer: Self-pay | Admitting: Obstetrics and Gynecology

## 2015-09-10 ENCOUNTER — Telehealth (HOSPITAL_COMMUNITY): Payer: Self-pay | Admitting: *Deleted

## 2015-09-10 ENCOUNTER — Encounter (HOSPITAL_COMMUNITY): Payer: Self-pay | Admitting: *Deleted

## 2015-09-10 NOTE — Telephone Encounter (Signed)
Preadmission screen  

## 2015-09-13 ENCOUNTER — Encounter (HOSPITAL_COMMUNITY)
Admission: AD | Disposition: A | Discharge status: Home / Self Care | Payer: Self-pay | Source: Ambulatory Visit | Attending: Obstetrics and Gynecology

## 2015-09-13 ENCOUNTER — Inpatient Hospital Stay (HOSPITAL_COMMUNITY)
Admission: AD | Admit: 2015-09-13 | Discharge: 2015-09-16 | DRG: 765 | Disposition: A | Discharge status: Home / Self Care | Payer: No Typology Code available for payment source | Source: Ambulatory Visit | Attending: Obstetrics and Gynecology | Admitting: Obstetrics and Gynecology

## 2015-09-13 ENCOUNTER — Inpatient Hospital Stay (HOSPITAL_COMMUNITY): Payer: No Typology Code available for payment source | Admitting: Anesthesiology

## 2015-09-13 ENCOUNTER — Encounter (HOSPITAL_COMMUNITY): Payer: Self-pay

## 2015-09-13 DIAGNOSIS — O322XX2 Maternal care for transverse and oblique lie, fetus 2: Secondary | ICD-10-CM | POA: Diagnosis present

## 2015-09-13 DIAGNOSIS — D649 Anemia, unspecified: Secondary | ICD-10-CM | POA: Diagnosis present

## 2015-09-13 DIAGNOSIS — Z302 Encounter for sterilization: Secondary | ICD-10-CM

## 2015-09-13 DIAGNOSIS — Z803 Family history of malignant neoplasm of breast: Secondary | ICD-10-CM | POA: Diagnosis not present

## 2015-09-13 DIAGNOSIS — Z3A38 38 weeks gestation of pregnancy: Secondary | ICD-10-CM | POA: Diagnosis not present

## 2015-09-13 DIAGNOSIS — O324XX2 Maternal care for high head at term, fetus 2: Principal | ICD-10-CM | POA: Diagnosis present

## 2015-09-13 DIAGNOSIS — O9902 Anemia complicating childbirth: Secondary | ICD-10-CM | POA: Diagnosis present

## 2015-09-13 DIAGNOSIS — O30049 Twin pregnancy, dichorionic/diamniotic, unspecified trimester: Secondary | ICD-10-CM | POA: Diagnosis present

## 2015-09-13 DIAGNOSIS — O9912 Other diseases of the blood and blood-forming organs and certain disorders involving the immune mechanism complicating childbirth: Secondary | ICD-10-CM | POA: Diagnosis present

## 2015-09-13 DIAGNOSIS — O9832 Other infections with a predominantly sexual mode of transmission complicating childbirth: Secondary | ICD-10-CM | POA: Diagnosis present

## 2015-09-13 DIAGNOSIS — O30043 Twin pregnancy, dichorionic/diamniotic, third trimester: Secondary | ICD-10-CM | POA: Diagnosis present

## 2015-09-13 DIAGNOSIS — O329XX0 Maternal care for malpresentation of fetus, unspecified, not applicable or unspecified: Secondary | ICD-10-CM | POA: Diagnosis present

## 2015-09-13 DIAGNOSIS — A630 Anogenital (venereal) warts: Secondary | ICD-10-CM | POA: Diagnosis present

## 2015-09-13 DIAGNOSIS — Z87410 Personal history of cervical dysplasia: Secondary | ICD-10-CM | POA: Diagnosis not present

## 2015-09-13 DIAGNOSIS — D6959 Other secondary thrombocytopenia: Secondary | ICD-10-CM | POA: Diagnosis present

## 2015-09-13 DIAGNOSIS — Z87891 Personal history of nicotine dependence: Secondary | ICD-10-CM | POA: Diagnosis not present

## 2015-09-13 LAB — CBC
HCT: 33.2 % — ABNORMAL LOW (ref 36.0–46.0)
HEMATOCRIT: 31.5 % — AB (ref 36.0–46.0)
HEMOGLOBIN: 10.6 g/dL — AB (ref 12.0–15.0)
Hemoglobin: 10.9 g/dL — ABNORMAL LOW (ref 12.0–15.0)
MCH: 28.3 pg (ref 26.0–34.0)
MCH: 28.1 pg (ref 26.0–34.0)
MCHC: 33.7 g/dL (ref 30.0–36.0)
MCHC: 32.8 g/dL (ref 30.0–36.0)
MCV: 84 fL (ref 78.0–100.0)
MCV: 85.6 fL (ref 78.0–100.0)
PLATELETS: 77 10*3/uL — AB (ref 150–400)
Platelets: 98 10*3/uL — ABNORMAL LOW (ref 150–400)
RBC: 3.75 MIL/uL — AB (ref 3.87–5.11)
RBC: 3.88 MIL/uL (ref 3.87–5.11)
RDW: 14.2 % (ref 11.5–15.5)
RDW: 14.2 % (ref 11.5–15.5)
WBC: 6.2 10*3/uL (ref 4.0–10.5)
WBC: 10.4 10*3/uL (ref 4.0–10.5)

## 2015-09-13 LAB — COMPREHENSIVE METABOLIC PANEL
ALBUMIN: 2.7 g/dL — AB (ref 3.5–5.0)
ALT: 11 U/L — ABNORMAL LOW (ref 14–54)
AST: 20 U/L (ref 15–41)
Alkaline Phosphatase: 157 U/L — ABNORMAL HIGH (ref 38–126)
Anion gap: 6 (ref 5–15)
BUN: 7 mg/dL (ref 6–20)
CHLORIDE: 104 mmol/L (ref 101–111)
CO2: 25 mmol/L (ref 22–32)
Calcium: 8.1 mg/dL — ABNORMAL LOW (ref 8.9–10.3)
Creatinine, Ser: 0.42 mg/dL — ABNORMAL LOW (ref 0.44–1.00)
GFR calc Af Amer: >60 mL/min (ref >60)
GLUCOSE: 79 mg/dL (ref 65–99)
POTASSIUM: 4.3 mmol/L (ref 3.5–5.1)
SODIUM: 135 mmol/L (ref 135–145)
Total Bilirubin: 0.3 mg/dL (ref 0.3–1.2)
Total Protein: 5.9 g/dL — ABNORMAL LOW (ref 6.5–8.1)

## 2015-09-13 LAB — PREPARE RBC (CROSSMATCH)

## 2015-09-13 LAB — RPR: RPR: NONREACTIVE

## 2015-09-13 SURGERY — Surgical Case
Anesthesia: Epidural

## 2015-09-13 MED ORDER — SODIUM BICARBONATE 8.4 % IV SOLN
INTRAVENOUS | Status: DC | PRN
  Administered 2015-09-13 – 2015-09-14 (×5): 5 mL via EPIDURAL

## 2015-09-13 MED ORDER — METHYLERGONOVINE MALEATE 0.2 MG/ML IJ SOLN
INTRAMUSCULAR | Status: DC | PRN
  Administered 2015-09-13: 0.2 mg via INTRAMUSCULAR

## 2015-09-13 MED ORDER — MORPHINE SULFATE (PF) 0.5 MG/ML IJ SOLN
INTRAMUSCULAR | Status: DC | PRN
  Administered 2015-09-13: 1 mg via INTRAVENOUS
  Administered 2015-09-13: 4 mg via EPIDURAL

## 2015-09-13 MED ORDER — SODIUM CHLORIDE 0.9 % IV SOLN: Freq: Once | INTRAVENOUS | Status: DC

## 2015-09-13 MED ORDER — SODIUM BICARBONATE 8.4 % IV SOLN
INTRAVENOUS | Status: AC
  Filled 2015-09-13: qty 50

## 2015-09-13 MED ORDER — MISOPROSTOL 25 MCG QUARTER TABLET
25.0000 ug | ORAL_TABLET | ORAL | Status: DC | PRN
  Administered 2015-09-13 (×2): 25 ug via VAGINAL
  Filled 2015-09-13 (×3): qty 0.25

## 2015-09-13 MED ORDER — LIDOCAINE HCL (PF) 1 % IJ SOLN
INTRAMUSCULAR | Status: DC | PRN
  Administered 2015-09-13: 11 mL

## 2015-09-13 MED ORDER — OXYCODONE-ACETAMINOPHEN 5-325 MG PO TABS: 1.0000 | ORAL_TABLET | ORAL | Status: DC | PRN

## 2015-09-13 MED ORDER — PHENYLEPHRINE 40 MCG/ML (10ML) SYRINGE FOR IV PUSH (FOR BLOOD PRESSURE SUPPORT): 80.0000 ug | PREFILLED_SYRINGE | INTRAVENOUS | Status: DC | PRN

## 2015-09-13 MED ORDER — METOCLOPRAMIDE HCL 5 MG/ML IJ SOLN
INTRAMUSCULAR | Status: DC | PRN
  Administered 2015-09-13 (×2): 5 mg via INTRAVENOUS

## 2015-09-13 MED ORDER — ONDANSETRON HCL 4 MG/2ML IJ SOLN
INTRAMUSCULAR | Status: AC
  Filled 2015-09-13: qty 2

## 2015-09-13 MED ORDER — LACTATED RINGERS IV SOLN
INTRAVENOUS | Status: DC | PRN
  Administered 2015-09-13: via INTRAVENOUS

## 2015-09-13 MED ORDER — LACTATED RINGERS IV SOLN
INTRAVENOUS | Status: DC | PRN
  Administered 2015-09-13 (×3): via INTRAVENOUS

## 2015-09-13 MED ORDER — EPHEDRINE 5 MG/ML INJ: 10.0000 mg | INTRAVENOUS | Status: DC | PRN

## 2015-09-13 MED ORDER — OXYTOCIN 10 UNIT/ML IJ SOLN
2.5000 [IU]/h | INTRAVENOUS | Status: DC
  Filled 2015-09-13: qty 4

## 2015-09-13 MED ORDER — SCOPOLAMINE 1 MG/3DAYS TD PT72
MEDICATED_PATCH | TRANSDERMAL | Status: DC | PRN
  Administered 2015-09-13: 1 via TRANSDERMAL

## 2015-09-13 MED ORDER — FENTANYL CITRATE (PF) 100 MCG/2ML IJ SOLN
INTRAMUSCULAR | Status: AC
  Filled 2015-09-13: qty 2

## 2015-09-13 MED ORDER — TERBUTALINE SULFATE 1 MG/ML IJ SOLN: 0.2500 mg | Freq: Once | INTRAMUSCULAR | Status: DC | PRN

## 2015-09-13 MED ORDER — OXYTOCIN 10 UNIT/ML IJ SOLN
1.0000 m[IU]/min | INTRAVENOUS | Status: DC
  Administered 2015-09-13: 2 m[IU]/min via INTRAVENOUS

## 2015-09-13 MED ORDER — METOCLOPRAMIDE HCL 5 MG/ML IJ SOLN
INTRAMUSCULAR | Status: AC
  Filled 2015-09-13: qty 2

## 2015-09-13 MED ORDER — ONDANSETRON HCL 4 MG/2ML IJ SOLN
INTRAMUSCULAR | Status: DC | PRN
  Administered 2015-09-13: 4 mg via INTRAVENOUS

## 2015-09-13 MED ORDER — DEXAMETHASONE SODIUM PHOSPHATE 10 MG/ML IJ SOLN
INTRAMUSCULAR | Status: DC | PRN
  Administered 2015-09-13: 10 mg via INTRAVENOUS

## 2015-09-13 MED ORDER — DIPHENHYDRAMINE HCL 50 MG/ML IJ SOLN: 12.5000 mg | INTRAMUSCULAR | Status: DC | PRN

## 2015-09-13 MED ORDER — FENTANYL CITRATE (PF) 100 MCG/2ML IJ SOLN
50.0000 ug | INTRAMUSCULAR | Status: DC | PRN
  Administered 2015-09-13: 100 ug via INTRAVENOUS
  Filled 2015-09-13: qty 2

## 2015-09-13 MED ORDER — DEXAMETHASONE SODIUM PHOSPHATE 10 MG/ML IJ SOLN
INTRAMUSCULAR | Status: AC
  Filled 2015-09-13: qty 1

## 2015-09-13 MED ORDER — FLEET ENEMA 7-19 GM/118ML RE ENEM: 1.0000 | ENEMA | RECTAL | Status: DC | PRN

## 2015-09-13 MED ORDER — LIDOCAINE-EPINEPHRINE (PF) 2 %-1:200000 IJ SOLN
INTRAMUSCULAR | Status: AC
  Filled 2015-09-13: qty 20

## 2015-09-13 MED ORDER — CLINDAMYCIN PHOSPHATE 900 MG/50ML IV SOLN
900.0000 mg | Freq: Once | INTRAVENOUS | Status: DC
  Filled 2015-09-13: qty 50

## 2015-09-13 MED ORDER — LACTATED RINGERS IV SOLN
500.0000 mL | Freq: Once | INTRAVENOUS | Status: AC
  Administered 2015-09-13: 500 mL via INTRAVENOUS

## 2015-09-13 MED ORDER — AMPICILLIN-SULBACTAM SODIUM 3 (2-1) G IJ SOLR
3.0000 g | Freq: Four times a day (QID) | INTRAMUSCULAR | Status: DC
  Administered 2015-09-13: 3 g via INTRAVENOUS
  Filled 2015-09-13 (×4): qty 3

## 2015-09-13 MED ORDER — OXYTOCIN BOLUS FROM INFUSION: 500.0000 mL | INTRAVENOUS | Status: DC

## 2015-09-13 MED ORDER — PHENYLEPHRINE 40 MCG/ML (10ML) SYRINGE FOR IV PUSH (FOR BLOOD PRESSURE SUPPORT)
80.0000 ug | PREFILLED_SYRINGE | INTRAVENOUS | Status: DC | PRN
  Filled 2015-09-13 (×2): qty 20

## 2015-09-13 MED ORDER — OXYCODONE-ACETAMINOPHEN 5-325 MG PO TABS: 2.0000 | ORAL_TABLET | ORAL | Status: DC | PRN

## 2015-09-13 MED ORDER — OXYTOCIN 10 UNIT/ML IJ SOLN: 1.0000 m[IU]/min | INTRAMUSCULAR | Status: DC

## 2015-09-13 MED ORDER — METHYLERGONOVINE MALEATE 0.2 MG/ML IJ SOLN
INTRAMUSCULAR | Status: AC
  Filled 2015-09-13: qty 1

## 2015-09-13 MED ORDER — GENTAMICIN SULFATE 40 MG/ML IJ SOLN
Freq: Once | INTRAVENOUS | Status: AC
  Administered 2015-09-13: 116 mL via INTRAVENOUS
  Filled 2015-09-13: qty 10

## 2015-09-13 MED ORDER — FENTANYL 2.5 MCG/ML BUPIVACAINE 1/10 % EPIDURAL INFUSION (WH - ANES)
14.0000 mL/h | INTRAMUSCULAR | Status: DC | PRN
  Administered 2015-09-13 (×3): 14 mL/h via EPIDURAL
  Filled 2015-09-13 (×4): qty 125

## 2015-09-13 MED ORDER — LACTATED RINGERS IV SOLN: 500.0000 mL | INTRAVENOUS | Status: DC | PRN

## 2015-09-13 MED ORDER — MIDAZOLAM HCL 2 MG/2ML IJ SOLN
INTRAMUSCULAR | Status: AC
  Filled 2015-09-13: qty 2

## 2015-09-13 MED ORDER — MORPHINE SULFATE (PF) 0.5 MG/ML IJ SOLN
INTRAMUSCULAR | Status: AC
  Filled 2015-09-13: qty 10

## 2015-09-13 MED ORDER — LIDOCAINE HCL (PF) 1 % IJ SOLN
30.0000 mL | INTRAMUSCULAR | Status: DC | PRN
  Filled 2015-09-13: qty 30

## 2015-09-13 MED ORDER — LACTATED RINGERS IV SOLN
INTRAVENOUS | Status: DC
  Administered 2015-09-13 (×3): via INTRAVENOUS

## 2015-09-13 MED ORDER — ACETAMINOPHEN 325 MG PO TABS: 650.0000 mg | ORAL_TABLET | ORAL | Status: DC | PRN

## 2015-09-13 MED ORDER — FENTANYL CITRATE (PF) 100 MCG/2ML IJ SOLN
INTRAMUSCULAR | Status: DC | PRN
  Administered 2015-09-13: 100 ug via EPIDURAL
  Administered 2015-09-14 (×2): 50 ug via INTRAVENOUS

## 2015-09-13 MED ORDER — OXYTOCIN 10 UNIT/ML IJ SOLN
INTRAMUSCULAR | Status: AC
  Filled 2015-09-13: qty 4

## 2015-09-13 MED ORDER — CITRIC ACID-SODIUM CITRATE 334-500 MG/5ML PO SOLN
30.0000 mL | ORAL | Status: DC | PRN
  Administered 2015-09-13: 30 mL via ORAL
  Filled 2015-09-13: qty 15

## 2015-09-13 MED ORDER — ONDANSETRON HCL 4 MG/2ML IJ SOLN
4.0000 mg | Freq: Four times a day (QID) | INTRAMUSCULAR | Status: DC | PRN
  Administered 2015-09-13 (×2): 4 mg via INTRAVENOUS
  Filled 2015-09-13 (×3): qty 2

## 2015-09-13 MED ORDER — OXYTOCIN 10 UNIT/ML IJ SOLN
40.0000 [IU] | INTRAVENOUS | Status: DC | PRN
  Administered 2015-09-13: 40 [IU] via INTRAVENOUS

## 2015-09-13 SURGICAL SUPPLY — 36 items
APL SKNCLS STERI-STRIP NONHPOA (GAUZE/BANDAGES/DRESSINGS)
BARRIER ADHS 3X4 INTERCEED (GAUZE/BANDAGES/DRESSINGS) ×3 IMPLANT
BENZOIN TINCTURE PRP APPL 2/3 (GAUZE/BANDAGES/DRESSINGS) IMPLANT
BRR ADH 4X3 ABS CNTRL BYND (GAUZE/BANDAGES/DRESSINGS) ×1
CHLORAPREP W/TINT 26ML (MISCELLANEOUS) ×3 IMPLANT
CLAMP CORD UMBIL (MISCELLANEOUS) IMPLANT
CLOSURE WOUND 1/2 X4 (GAUZE/BANDAGES/DRESSINGS)
CLOTH BEACON ORANGE TIMEOUT ST (SAFETY) ×3 IMPLANT
DRSG OPSITE POSTOP 4X10 (GAUZE/BANDAGES/DRESSINGS) ×3 IMPLANT
ELECT REM PT RETURN 9FT ADLT (ELECTROSURGICAL) ×3
ELECTRODE REM PT RTRN 9FT ADLT (ELECTROSURGICAL) ×1 IMPLANT
EXTRACTOR VACUUM BELL STYLE (SUCTIONS) IMPLANT
GLOVE BIO SURGEON STRL SZ7 (GLOVE) ×3 IMPLANT
GLOVE BIOGEL PI IND STRL 7.0 (GLOVE) ×3 IMPLANT
GLOVE BIOGEL PI INDICATOR 7.0 (GLOVE) ×6
GOWN STRL REUS W/TWL LRG LVL3 (GOWN DISPOSABLE) ×9 IMPLANT
KIT ABG SYR 3ML LUER SLIP (SYRINGE) IMPLANT
LIQUID BAND (GAUZE/BANDAGES/DRESSINGS) IMPLANT
NDL HYPO 25X5/8 SAFETYGLIDE (NEEDLE) IMPLANT
NEEDLE HYPO 25X5/8 SAFETYGLIDE (NEEDLE) IMPLANT
NS IRRIG 1000ML POUR BTL (IV SOLUTION) ×3 IMPLANT
PACK C SECTION WH (CUSTOM PROCEDURE TRAY) ×3 IMPLANT
PAD OB MATERNITY 4.3X12.25 (PERSONAL CARE ITEMS) ×3 IMPLANT
PENCIL SMOKE EVAC W/HOLSTER (ELECTROSURGICAL) ×3 IMPLANT
RTRCTR C-SECT PINK 25CM LRG (MISCELLANEOUS) ×3 IMPLANT
STRIP CLOSURE SKIN 1/2X4 (GAUZE/BANDAGES/DRESSINGS) IMPLANT
SUT CHROMIC 0 CTX 36 (SUTURE) IMPLANT
SUT MON AB 4-0 PS1 27 (SUTURE) ×3 IMPLANT
SUT PLAIN 0 NONE (SUTURE) ×2 IMPLANT
SUT PLAIN 2 0 XLH (SUTURE) ×3 IMPLANT
SUT VIC AB 0 CTX 36 (SUTURE) ×15
SUT VIC AB 0 CTX36XBRD ANBCTRL (SUTURE) ×5 IMPLANT
SUT VIC AB 2-0 CT1 27 (SUTURE) ×3
SUT VIC AB 2-0 CT1 TAPERPNT 27 (SUTURE) ×1 IMPLANT
TOWEL OR 17X24 6PK STRL BLUE (TOWEL DISPOSABLE) ×3 IMPLANT
TRAY FOLEY CATH SILVER 14FR (SET/KITS/TRAYS/PACK) IMPLANT

## 2015-09-13 NOTE — Progress Notes (Signed)
Updated by CNM that Baby B remains at -3 station and pt is very uncomfortable. Upon arrival pt is moaning and reports pressure in previous area.  States they were able to get her comfortable but pain was returning.  Consents to cesarean section.  Indication Arrest of descent, suspect malpresentation.  Discussed thrombocytopenia and risk of bleeding.  Pt consents to blood transfusion prn. Perineum is dry, no active bleeding noted.   Cat 1 tracing.  Per RN, Temp at 1000 pm was 100.1.  Unasyn given at that time. Anesthesiologist updated upon entering room.

## 2015-09-13 NOTE — Progress Notes (Signed)
Subjective: Called to room for possible SROM. Pt uncomfortable, feeling pressure and pain from contractions on left side. Family in room for support.   Objective: BP 108/61 mmHg  Pulse 73  Temp(Src) 97.3 F (36.3 C) (Oral)  Resp 18  Ht 5\' 7"  (1.702 m)  Wt 106.595 kg (235 lb)  BMI 36.80 kg/m2  SpO2 99%  LMP 09/27/2014 (LMP Unknown)   Total I/O In: 375 [I.V.:375] Out: 50 [Emesis/NG output:50]  FHT:  Baby A: Category 1 , 130 bpm, moderate variability, +accels, no decels Baby B: Category 1, 140 bpm, moderate variability, +accels, no decels UC:   regular, every 2-3 minutes SVE:   Dilation: 6 Effacement (%): 80 Station: 0 Exam by:: Alphonzo Severanceachel Zakiya Sporrer, CNM  Membranes: SROM at 1025 Internal: None  Augmentation/Induction Cytotec x1 0230 Cytotec x2 [phone]   Pain management: epidural   Assessment:  IUP at 38.0 weeks IOL d/t twins SROM at  1025 NICHD: A &B, Both Category 1 FT GBS neg   Plan: Dr. Dion BodyVarnado Consulted Set up for twin delivery in Room Continuous monitoring Frequent position changes to facilitate fetal rotation and descent. Expectant management   Alphonzo SeveranceRachel Evo Aderman CNM, [place] 09/13/2015, 10:46 AM

## 2015-09-13 NOTE — H&P (Signed)
Teresa Farley is a 30 y.o. female, G4 P1021 at 38.0 weeks with Di/Di twins.  [facility] BTL consent signed 08/10/15  Patient Active Problem List   Diagnosis Date Noted  . Twin gestation, dichorionic diamniotic 09/13/2015  . Twins 03/20/2015  . Vaginal bleeding in pregnancy 03/20/2015  . Condyloma 02/23/2011    Pregnancy Course: Patient entered care at 7.0 weeks.   EDC of 09/27/15 was established by [place].      [place] evaluations:  8.4 weeks - Dating:  Anterverted uterus, Di/Di twins, C w/D, FHR: A 125 B 134,   20.0 weeks - Anatomy: A:  EFW:  15oz, vertex, anterior placenta,  B: FHR 158, EFW 14oz, breech, anterior placenta, normal fluid    Cervical length 4.36  22.4 weeks - FU: Fetus B: DI/DI twin pregnancy - Membrane Seen Twin A: Lower maternal right - vertex presentation. Anterior placenta. Fluid is normal. EFW = 653g - 73% Twin B: upper maternal left - vertex presentation. Anterior placenta. Ffluid is normal. EFW = 633g - 70%. Placental cord insert - seen. C-spine, T-spine seen. L-spine, S-spine not well visualized due to fetal position.  Cervix appears closed measured transabdominally 4.6cm. Adnexas unremarkable.  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 Anemia   . H/O varicella   . Irregular periods/menstrual cycles   . Abnormal Pap smear 2012  . Low iron   . Condyloma 02/23/2011    Patient states that she is receiving  treatment from [facility]    Past Surgical History  Procedure Laterality Date  . Dilation and curettage of uterus  june 2012   Family History: family history includes Breast cancer in her maternal grandmother; Cancer in her maternal grandmother. Social History:  reports that she quit smoking about 6 months ago. Her smoking use included Cigarettes. She has a 2.5 pack-year smoking history. She has never used smokeless tobacco. She reports that she does not drink alcohol or use illicit drugs.   Prenatal Transfer Tool  Maternal Diabetes: No Genetic Screening: Normal Maternal Ultrasounds/Referrals: Normal Fetal Ultrasounds or other Referrals:  Referred to Materal Fetal Medicine  Maternal Substance Abuse:  No Significant Maternal Medications:  None Significant Maternal Lab Results: None   ROS:  See HPI above, all other systems are negative  No Known Allergies    Blood pressure 121/66, pulse 79, temperature 97.8 F (36.6 C), temperature source Oral, resp. rate 18, height  (1.702 m), weight 235 lb (106.595 kg), last menstrual period 09/27/2014.  Maternal Exam:  Uterine Assessment: Contraction frequency is rare.  Abdomen: Gravid, non tender. Fundal height is aga.  Normal external genitalia, vulva, cervix, uterus and adnexa.  No lesions noted on exam.  Pelvis adequate for delivery.  Fetal presentation: Vertex/Vertex by [place]  on 09/09/15 and bedside US 09/13/15  Fetal Exam:  Monitor Surveillance : Continuous Monitoring  Mode: Ultrasound.  NICHD: Category 1 x2 CTXs: occasionally EFW   A 6'15" and B 7.0 lbs  Physical Exam: Nursing note and vitals reviewed General: alert and cooperative She appears well nourished Psychiatric: Normal mood and affect. Her behavior is normal Head: Normocephalic Eyes: Pupils are equal, round, and reactive to light Neck: Normal range of motion Cardiovascular: RRR without murmur  Respiratory: CTAB. Effort normal  Abd: soft,  non-tender, +BS, no rebound, no guarding  Genitourinary: Vagina normal  Neurological: A&Ox3 Skin: Warm and dry  Musculoskeletal: Normal range of motion  Homan's sign negative bilaterally No evidence of DVTs.  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, CIN 1 GC: neg    Chlamydia: neg Genetic screenings:   wnl Glucola:  113   Assessment:  IUP at 38 weeks NICHD: Category 1 x 2 Membranes: intact Bishop Score: 2 GBS negative 09/01/15 Diagnosis: UIP term di/di twins IOL options reviewed with patient including cytotec, foley bulb, AROM, and pitocin R&B of IOL reviewed including serial induction, failure, and/or CS requirement Pain management options reviewed Pt and family verbalize understanding, agrees with treatment plan  and wishes to proceed with induction process    Plan:  Admit to L&D for IOL d/t twins Start induction with cytitec Regular diet prior to starting pitocin Clear/Thin diet after pitocin starts  Will reassess at 0700 and place next dose cytotec or foley bulb  Continue with labor mgmt as ordered  IV pain medication per orders PRN Epidural now. Dry placement Foley cath after patient is comfortable with epidural  Anticipate SVD   Attending MD available at all times.   Aunna Snooks, CNM, MSN 09/13/2015, 2:23 AM

## 2015-09-13 NOTE — Progress Notes (Signed)
Gwenlyn Foundshley R [place]ee MRN: [id number]  Subjective: -Care assumed of 30 y.o. [id number] at 38wks who presents for IOL for Twin Gestation. Patient with delivery of Twin A at [id number]. US reveals Twin B at ballotable station in vertex, but LOT presentation. Nurse call and reports patient with vaginal pressure and pain.  In room to assess.  Patient vomiting and reporting fatigue.  Mother and sister at bedside.   Objective: BP 118/61 mmHg  Pulse 79  Temp(Src) 99.1 F (37.3 C) (Oral)  Resp 18  Ht 5\' 7"  (1.702 m)  Wt 106.595 kg (235 lb)  BMI 36.80 kg/m2  SpO2 99%  LMP 09/27/2014 (LMP Unknown)  Breastfeeding? Unknown I/O last 3 completed shifts: In: 935 [P.O.:60; I.V.:875] Out: 800 [Urine:700; Emesis/NG output:50; Blood:50]    Fetal Monitoring: FHT: 155 bpm, Mod Var, +Variable Decels, +Accels UC: Q4-405min, palpates strong    Vaginal Exam: SVE:   Dilation: 10 Effacement (%): 80 Station: -3 Exam by:: Catrena Vari CNM Membranes:AROM of Twin B with moderate amt clear fluid  Internal Monitors: None  Augmentation/Induction: Pitocin:472mUn/min  Cytotec: None  Assessment:  TIUP at 38wks Cat II FT  2nd Stage Labor Delivery of Twin A   Plan: -Position change to promote resolution of Cat II FT -Discussed AROM r/b including increased risk of infection, cord prolapse, fetal intolerance, and decreased labor time. No questions or concerns and patient desires to proceed with AROM.  -Nurse instructed to provide patient with PCA bolus dosing to promote comfort -Continue other mgmt as ordered -Dr. Enid BaasEV updated on patient status   Valma CavaJessica L Dejha King,MSN, CNM 09/13/2015, 9:03 PM

## 2015-09-13 NOTE — Anesthesia Preprocedure Evaluation (Addendum)
Anesthesia Evaluation  Patient identified by MRN, date of birth, ID band Patient awake    Reviewed: Allergy & Precautions, H&P , NPO status , Patient's Chart, lab work & pertinent test results  History of Anesthesia Complications Negative for: history of anesthetic complications  Airway Mallampati: II  TM Distance: >3 FB Neck ROM: full    Dental no notable dental hx. (+) Teeth Intact   Pulmonary neg pulmonary ROS, former smoker,    Pulmonary exam normal breath sounds clear to auscultation       Cardiovascular negative cardio ROS Normal cardiovascular exam Rhythm:regular Rate:Normal     Neuro/Psych negative neurological ROS  negative psych ROS   GI/Hepatic negative GI ROS, Neg liver ROS,   Endo/Other  negative endocrine ROS  Renal/GU negative Renal ROS  negative genitourinary   Musculoskeletal   Abdominal   Peds  Hematology negative hematology ROS (+)   Anesthesia Other Findings Asked to place dry epidural catheter. plt 98k  Reproductive/Obstetrics (+) Pregnancy Twins.  Plt 98k.                           Anesthesia Physical Anesthesia Plan  ASA: II  Anesthesia Plan: Epidural   Post-op Pain Management:    Induction:   Airway Management Planned:   Additional Equipment:   Intra-op Plan:   Post-operative Plan:   Informed Consent: I have reviewed the patients History and Physical, chart, labs and discussed the procedure including the risks, benefits and alternatives for the proposed anesthesia with the patient or authorized representative who has indicated his/her understanding and acceptance.     Plan Discussed with:   Anesthesia Plan Comments: (2255: To CD for second twin (probable transverse lie; failure to descend). Will try to use epidural. GETA as backup. Platelets 77K and Hgb 10.9 )      Anesthesia Quick Evaluation

## 2015-09-13 NOTE — Progress Notes (Signed)
In to assist with delivery.  Pt appears to be uncomfortable, grunting with pushing. On cervical exam, thick anterior lip noted.  Appears bruised. Unable to reduce it effectively.  Baby A's station was 2+.  Instructed pt to stop pushing until complete dilation.  Asked anesthesia to reassess for pain control.  Mother laid to her left side, peanut placed between legs.  Fetal scalp electrode applied to Baby A for optimal monitoring.  Baby B on Tocometry.  Pt slowly became more comfortable. Encouraged deep breathing.

## 2015-09-13 NOTE — Progress Notes (Signed)
ANTIBIOTIC CONSULT NOTE - INITIAL  Pharmacy Consult for Gentamicin Indication: Surgical Px  No Known Allergies  Patient Measurements: Height: 5\' 7"  (170.2 cm) Weight: 235 lb (106.595 kg) IBW/kg (Calculated) : 61.6 Adjusted Body Weight: 75 kg  Vital Signs: Temp: 100.1 F (37.8 C) (04/16 2201) Temp Source: Oral (04/16 2201) BP: 124/55 mmHg (04/16 2257) Pulse Rate: 83 (04/16 2257)  Labs:  Recent Labs  09/13/15 0130 09/13/15 1125 09/13/15 1853  WBC 6.2  --  10.4  HGB 10.6*  --  10.9*  PLT 98*  --  77*  CREATININE  --  0.42*  --    No results for input(s): GENTTROUGH, GENTPEAK, GENTRANDOM in the last 72 hours.   Microbiology: Recent Results (from the past 720 hour(s))  OB RESULT CONSOLE Group B Strep     Status: None   Collection Time: 09/01/15 12:00 AM  Result Value Ref Range Status   GBS Negative  Final    Medications:  Unasyn   Assessment: 30 y.o. female [id number] at 7073w0d with arrest of descent. To OR for c/section  Plan:  Gentamicin 400mg  with clindamycin 900 mg IV x 1   Sherice Ijames Scarlett 09/13/2015,11:27 PM

## 2015-09-13 NOTE — Progress Notes (Signed)
Subjective: Sleeping, Comfortable S/p epidural.  Family sleeping on bedside couch.   Objective: BP 130/81 mmHg  Pulse 68  Temp(Src) 97.3 F (36.3 C) (Oral)  Resp 18  Ht 5\' 7"  (1.702 m)  Wt 106.595 kg (235 lb)  BMI 36.80 kg/m2  SpO2 98%  LMP 09/27/2014 (LMP Unknown)      FHT:  Baby A:  Category 1 , 135 bpm, moderate variability, +accels, no decels Baby B: Category 1, 130 bpm, moderate variability, +accels, no decels UC:   Irregular, every 2-5 min SVE:   Dilation: 1 Effacement (%): Thick Station: Ballotable Exam by:: Standard, CNm Membranes:  intact Internal:  None  Augmentation/Induction Cytotec x1  0230 Cytotec x2 [id number]  Pain management: epidural   Assessment:  IUP at 38.0 weeks IOL d/t twins Cervical Ripening Cytotec x2 doses, last at 0652 Membranes intact NICHD:  A &B, Both Category 1 FT GBS neg  Plan: Continue labor plan Continuous monitoring Rest Frequent position changes to facilitate fetal rotation and descent. Recheck/reassess cervical status after 1100 to determine cervical ripening  Progress Consult PRN  Alphonzo Severanceachel Crestina Strike CNM, MSN  09/13/2015, 7:49 AM

## 2015-09-13 NOTE — Progress Notes (Signed)
Subjective: Resting, easy to waken. Comfortable with epidural does not feel "anything" at this time.   Objective: BP 122/78 mmHg  Pulse 93  Temp(Src) 98.7 F (37.1 C) (Oral)  Resp 18  Ht 5\' 7"  (1.702 m)  Wt 106.595 kg (235 lb)  BMI 36.80 kg/m2  SpO2 99%  LMP 09/27/2014 (LMP Unknown)   Total I/O In: 375 [I.V.:375] Out: 650 [Urine:600; Emesis/NG output:50]  FHT: Baby A: Category 1 , 130 bpm, moderate variability, +accels, no decels Baby B: Category 1, 140 bpm, moderate variability, +accels, no decels UC:   regular, every 2-3 minutes SVE:   Dilation: 7 Effacement (%): 80 Station: 0 Exam by:: Fleet Contrasachel, CNM  Membranes: SROM at 1025 Internal: None  Augmentation/Induction S/p Cytotec x2, last dose 0652  Pain management: epidural   Assessment:  IUP at 38.0 weeks IOL d/t twins SROM at 1025 NICHD: A &B, Both Category 1 FT GBS neg  Plan: Labor room currently Set up for twin delivery  Continuous monitoring Frequent position changes to facilitate fetal rotation and descent. Expectant management Recheck in 1-2 hrs or earlier PRN Dr. Dion BodyVarnado on standby   Teresa Farley CNM, [place] 09/13/2015, 1:11 PM

## 2015-09-13 NOTE — Anesthesia Procedure Notes (Signed)
Epidural Patient location during procedure: OB  Staffing Anesthesiologist: Phillips GroutARIGNAN, Elisandro Jarrett Performed by: anesthesiologist   Preanesthetic Checklist Completed: patient identified, site marked, surgical consent, pre-op evaluation, timeout performed, IV checked, risks and benefits discussed and monitors and equipment checked  Epidural Patient position: sitting Prep: DuraPrep Patient monitoring: heart rate, continuous pulse ox and blood pressure Approach: midline Location: L3-L4 Injection technique: LOR saline  Needle:  Needle type: Tuohy  Needle gauge: 17 G Needle length: 9 cm and 9 Needle insertion depth: 6 cm Catheter type: closed end flexible Catheter size: 20 Guage Catheter at skin depth: 10 cm  Assessment Events: blood not aspirated, injection not painful, no injection resistance, negative IV test and no paresthesia  Additional Notes Dry cath threaded without issues. No test dose. Flushed with pf NS. RN will call later to start infusion.

## 2015-09-13 NOTE — Progress Notes (Signed)
Teresa Farley MRN: [id number]  Subjective: -Nurse call and reports patient with c/o increased rectal pressure and discomfort.  In room to assess.  Patient crying and reports pain.  Requests vaginal exam.   Objective: BP 135/69 mmHg  Pulse 84  Temp(Src) 100.1 F (37.8 C) (Oral)  Resp 18  Ht 5\' 7"  (1.702 m)  Wt 106.595 kg (235 lb)  BMI 36.80 kg/m2  SpO2 99%  LMP 09/27/2014 (LMP Unknown)  Breastfeeding? Unknown I/O last 3 completed shifts: In: 935 [P.O.:60; I.V.:875] Out: 800 [Urine:700; Emesis/NG output:50; Blood:50]    Results for orders placed or performed during the hospital encounter of 09/13/15 (from the past 24 hour(s))  CBC     Status: Abnormal   Collection Time: 09/13/15  1:30 AM  Result Value Ref Range   WBC 6.2 4.0 - 10.5 K/uL   RBC 3.75 (L) 3.87 - 5.11 MIL/uL   Hemoglobin 10.6 (L) 12.0 - 15.0 g/dL   HCT 40.931.5 (L) 81.136.0 - 91.446.0 %   MCV 84.0 78.0 - 100.0 fL   MCH 28.3 26.0 - 34.0 pg   MCHC 33.7 30.0 - 36.0 g/dL   RDW 78.214.2 95.611.5 - 21.315.5 %   Platelets 98 (L) 150 - 400 K/uL  RPR     Status: None   Collection Time: 09/13/15  1:30 AM  Result Value Ref Range   RPR Ser Ql Non Reactive Non Reactive  Type and screen [facility]     Status: None (Preliminary result)   Collection Time: 09/13/15  1:30 AM  Result Value Ref Range   ABO/RH(D) B POS    Antibody Screen NEG    Sample Expiration 09/16/2015    Unit Number [id number]    Blood Component Type RED CELLS,LR    Unit division 00    Status of Unit ALLOCATED    Transfusion Status OK TO TRANSFUSE    Crossmatch Result Compatible    Unit Number [id number]    Blood Component Type RBC LR PHER2    Unit division 00    Status of Unit ALLOCATED    Transfusion Status OK TO TRANSFUSE    Crossmatch Result Compatible   Comprehensive metabolic panel     Status: Abnormal   Collection Time: 09/13/15 11:25 AM  Result Value Ref Range   Sodium 135 135 - 145 mmol/L   Potassium 4.3 3.5 - 5.1 mmol/L   Chloride 104 101 - 111 mmol/L   CO2 25 22 - 32 mmol/L   Glucose, Bld 79 65 - 99 mg/dL   BUN 7 6 - 20 mg/dL   Creatinine, Ser 2.530.42 (L) 0.44 - 1.00 mg/dL   Calcium 8.1 (L) 8.9 - 10.3 mg/dL   Total Protein 5.9 (L) 6.5 - 8.1 g/dL   Albumin 2.7 (L) 3.5 - 5.0 g/dL   AST 20 15 - 41 U/L   ALT 11 (L) 14 - 54 U/L   Alkaline Phosphatase 157 (H) 38 - 126 U/L   Total Bilirubin 0.3 0.3 - 1.2 mg/dL   GFR calc non Af Amer >60 >60 mL/min   GFR calc Af Amer >60 >60 mL/min   Anion gap 6 5 - 15  Prepare RBC     Status: None   Collection Time: 09/13/15  1:37 PM  Result Value Ref Range   Order Confirmation ORDER PROCESSED BY BLOOD BANK   CBC     Status: Abnormal   Collection Time: 09/13/15  6:53 PM  Result Value Ref Range   WBC  10.4 4.0 - 10.5 K/uL   RBC 3.88 3.87 - 5.11 MIL/uL   Hemoglobin 10.9 (L) 12.0 - 15.0 g/dL   HCT 04.5 (L) 40.9 - 81.1 %   MCV 85.6 78.0 - 100.0 fL   MCH 28.1 26.0 - 34.0 pg   MCHC 32.8 30.0 - 36.0 g/dL   RDW 91.4 78.2 - 95.6 %   Platelets 77 (L) 150 - 400 K/uL     Fetal Monitoring: FHT: 145 bpm, Mod Var, -Decels, -Accels UC: Q2-7min, palpates moderate to strong    Vaginal Exam: SVE:   Dilation: 10 Effacement (%): 80 Station: -3 Exam by:: Kashmere Daywalt CNM Membranes:AROM of Fetus B at 2039 Internal Monitors: None Twin A cord noted at introitus  Augmentation/Induction: Pitocin:40mUn/min Cytotec: None  Assessment:  TIUP at 38wks Cat I FT  2nd Stage Labor Delivery of Twin A at 1707 Thrombocytopenia  Plan: -Anesthesia called to evaluate patient for comfort -Dr. Enid Baas consulted with recommendation for C/S discussed -In room to discuss recommendation with patient with is tearful and states she needs time to decide. -Dr. Enid Baas updated, but en route -Order for blood sent; plan to have 2U at bedside   Joyce Copa Adaley Kiene,MSN, CNM 09/13/2015, 10:24 PM

## 2015-09-13 NOTE — Progress Notes (Signed)
Labor Progress  Subjective: increase pain in back  Objective: BP 121/66 mmHg  Pulse 79  Temp(Src) 97.8 F (36.6 C) (Oral)  Resp 18  Ht 5\' 7"  (1.702 m)  Wt 235 lb (106.595 kg)  BMI 36.80 kg/m2  LMP 09/27/2014 (LMP Unknown)     FHT: A 140, B 135,moderate variability x2, + accel, x2, no decel x2 CTX:  irregular, Uterus gravid, soft non tender SVE:  Dilation: 1 Effacement (%): Thick Station: Ballotable Exam by:: Rosaleah Person, CNm   Assessment:  IUP at 38.0 weeks IOL d/t twins NICHD: Category 1 x2 Membranes:  intact Induction:   Cytotec x1  Pain management: n/a  GBS neg  Plan: Continue labor plan Continuous monitoring Rest Frequent position changes to facilitate fetal rotation and descent. cytotec #2 Turn on epidural pump     Teresa Farley, CNM, MSN 09/13/2015. 6:48 AM

## 2015-09-14 ENCOUNTER — Encounter (HOSPITAL_COMMUNITY): Payer: Self-pay

## 2015-09-14 LAB — CBC
HEMATOCRIT: 30.5 % — AB (ref 36.0–46.0)
HEMOGLOBIN: 9.9 g/dL — AB (ref 12.0–15.0)
MCH: 27.7 pg (ref 26.0–34.0)
MCHC: 32.5 g/dL (ref 30.0–36.0)
MCV: 85.4 fL (ref 78.0–100.0)
Platelets: 90 10*3/uL — ABNORMAL LOW (ref 150–400)
RBC: 3.57 MIL/uL — AB (ref 3.87–5.11)
RDW: 14.1 % (ref 11.5–15.5)
WBC: 14.2 10*3/uL — AB (ref 4.0–10.5)

## 2015-09-14 MED ORDER — SODIUM CHLORIDE 0.9% FLUSH: 3.0000 mL | INTRAVENOUS | Status: DC | PRN

## 2015-09-14 MED ORDER — TETANUS-DIPHTH-ACELL PERTUSSIS 5-2.5-18.5 LF-MCG/0.5 IM SUSP: 0.5000 mL | Freq: Once | INTRAMUSCULAR | Status: DC

## 2015-09-14 MED ORDER — SIMETHICONE 80 MG PO CHEW
80.0000 mg | CHEWABLE_TABLET | ORAL | Status: DC | PRN
Start: 2015-09-14 — End: 2015-09-16

## 2015-09-14 MED ORDER — FERROUS SULFATE 325 (65 FE) MG PO TABS
325.0000 mg | ORAL_TABLET | Freq: Two times a day (BID) | ORAL | Status: DC
  Administered 2015-09-14 – 2015-09-16 (×4): 325 mg via ORAL
  Filled 2015-09-14 (×5): qty 1

## 2015-09-14 MED ORDER — DIPHENHYDRAMINE HCL 25 MG PO CAPS: 25.0000 mg | ORAL_CAPSULE | Freq: Four times a day (QID) | ORAL | Status: DC | PRN

## 2015-09-14 MED ORDER — MEPERIDINE HCL 25 MG/ML IJ SOLN
6.2500 mg | INTRAMUSCULAR | Status: DC | PRN
Start: 2015-09-14 — End: 2015-09-14

## 2015-09-14 MED ORDER — ZOLPIDEM TARTRATE 5 MG PO TABS: 5.0000 mg | ORAL_TABLET | Freq: Every evening | ORAL | Status: DC | PRN

## 2015-09-14 MED ORDER — IBUPROFEN 600 MG PO TABS: 600.0000 mg | ORAL_TABLET | Freq: Four times a day (QID) | ORAL | Status: DC

## 2015-09-14 MED ORDER — METHYLERGONOVINE MALEATE 0.2 MG/ML IJ SOLN: 0.2000 mg | INTRAMUSCULAR | Status: DC | PRN

## 2015-09-14 MED ORDER — ACETAMINOPHEN 325 MG PO TABS
650.0000 mg | ORAL_TABLET | ORAL | Status: DC | PRN
  Administered 2015-09-15 – 2015-09-16 (×5): 650 mg via ORAL
  Filled 2015-09-14 (×5): qty 2

## 2015-09-14 MED ORDER — MIDAZOLAM HCL 5 MG/5ML IJ SOLN
INTRAMUSCULAR | Status: DC | PRN
  Administered 2015-09-13 – 2015-09-14 (×2): 1 mg via INTRAVENOUS

## 2015-09-14 MED ORDER — METHYLERGONOVINE MALEATE 0.2 MG PO TABS: 0.2000 mg | ORAL_TABLET | ORAL | Status: DC | PRN

## 2015-09-14 MED ORDER — SCOPOLAMINE 1 MG/3DAYS TD PT72
1.0000 | MEDICATED_PATCH | Freq: Once | TRANSDERMAL | Status: DC
  Filled 2015-09-14: qty 1

## 2015-09-14 MED ORDER — HYDROMORPHONE HCL 1 MG/ML IJ SOLN: 0.2500 mg | INTRAMUSCULAR | Status: DC | PRN

## 2015-09-14 MED ORDER — NALBUPHINE HCL 10 MG/ML IJ SOLN: 5.0000 mg | Freq: Once | INTRAMUSCULAR | Status: DC | PRN

## 2015-09-14 MED ORDER — MEASLES, MUMPS & RUBELLA VAC ~~LOC~~ INJ
0.5000 mL | INJECTION | Freq: Once | SUBCUTANEOUS | Status: DC
  Filled 2015-09-14: qty 0.5

## 2015-09-14 MED ORDER — SENNOSIDES-DOCUSATE SODIUM 8.6-50 MG PO TABS
2.0000 | ORAL_TABLET | ORAL | Status: DC
  Administered 2015-09-15 – 2015-09-16 (×2): 2 via ORAL
  Filled 2015-09-14 (×2): qty 2

## 2015-09-14 MED ORDER — MENTHOL 3 MG MT LOZG: 1.0000 | LOZENGE | OROMUCOSAL | Status: DC | PRN

## 2015-09-14 MED ORDER — KETOROLAC TROMETHAMINE 30 MG/ML IJ SOLN
30.0000 mg | Freq: Four times a day (QID) | INTRAMUSCULAR | Status: AC | PRN
  Administered 2015-09-14: 30 mg via INTRAVENOUS

## 2015-09-14 MED ORDER — PANTOPRAZOLE SODIUM 20 MG PO TBEC
20.0000 mg | DELAYED_RELEASE_TABLET | Freq: Every day | ORAL | Status: DC
  Administered 2015-09-14 – 2015-09-15 (×2): 20 mg via ORAL
  Filled 2015-09-14 (×3): qty 1

## 2015-09-14 MED ORDER — NALBUPHINE HCL 10 MG/ML IJ SOLN: 5.0000 mg | INTRAMUSCULAR | Status: DC | PRN

## 2015-09-14 MED ORDER — WITCH HAZEL-GLYCERIN EX PADS: 1.0000 "application " | MEDICATED_PAD | CUTANEOUS | Status: DC | PRN

## 2015-09-14 MED ORDER — ACETAMINOPHEN 500 MG PO TABS
1000.0000 mg | ORAL_TABLET | Freq: Four times a day (QID) | ORAL | Status: AC
  Administered 2015-09-14 – 2015-09-15 (×4): 1000 mg via ORAL
  Filled 2015-09-14 (×4): qty 2

## 2015-09-14 MED ORDER — DIBUCAINE 1 % RE OINT: 1.0000 "application " | TOPICAL_OINTMENT | RECTAL | Status: DC | PRN

## 2015-09-14 MED ORDER — OXYCODONE HCL 5 MG PO TABS
10.0000 mg | ORAL_TABLET | ORAL | Status: DC | PRN
  Administered 2015-09-15 – 2015-09-16 (×3): 10 mg via ORAL
  Filled 2015-09-14 (×2): qty 2

## 2015-09-14 MED ORDER — COCONUT OIL OIL: 1.0000 "application " | TOPICAL_OIL | Status: DC | PRN

## 2015-09-14 MED ORDER — KETOROLAC TROMETHAMINE 30 MG/ML IJ SOLN: 30.0000 mg | Freq: Four times a day (QID) | INTRAMUSCULAR | Status: AC | PRN

## 2015-09-14 MED ORDER — PROCHLORPERAZINE EDISYLATE 5 MG/ML IJ SOLN
10.0000 mg | Freq: Once | INTRAMUSCULAR | Status: DC
  Filled 2015-09-14: qty 2

## 2015-09-14 MED ORDER — ONDANSETRON HCL 4 MG/2ML IJ SOLN: 4.0000 mg | Freq: Three times a day (TID) | INTRAMUSCULAR | Status: DC | PRN

## 2015-09-14 MED ORDER — SIMETHICONE 80 MG PO CHEW
80.0000 mg | CHEWABLE_TABLET | Freq: Three times a day (TID) | ORAL | Status: DC
  Administered 2015-09-14 – 2015-09-16 (×7): 80 mg via ORAL
  Filled 2015-09-14 (×7): qty 1

## 2015-09-14 MED ORDER — NALOXONE HCL 0.4 MG/ML IJ SOLN: 0.4000 mg | INTRAMUSCULAR | Status: DC | PRN

## 2015-09-14 MED ORDER — DEXTROSE 5 % IV SOLN
1.0000 ug/kg/h | INTRAVENOUS | Status: DC | PRN
  Filled 2015-09-14: qty 2

## 2015-09-14 MED ORDER — OXYTOCIN 10 UNIT/ML IJ SOLN: 2.5000 [IU]/h | INTRAVENOUS | Status: AC

## 2015-09-14 MED ORDER — KETOROLAC TROMETHAMINE 30 MG/ML IJ SOLN
INTRAMUSCULAR | Status: AC
  Filled 2015-09-14: qty 1

## 2015-09-14 MED ORDER — LACTATED RINGERS IV SOLN
INTRAVENOUS | Status: DC
  Administered 2015-09-14 – 2015-09-15 (×3): via INTRAVENOUS

## 2015-09-14 MED ORDER — OXYCODONE HCL 5 MG PO TABS
5.0000 mg | ORAL_TABLET | ORAL | Status: DC | PRN
  Administered 2015-09-14 – 2015-09-15 (×6): 5 mg via ORAL
  Filled 2015-09-14 (×9): qty 1

## 2015-09-14 MED ORDER — PRENATAL MULTIVITAMIN CH
1.0000 | ORAL_TABLET | Freq: Every day | ORAL | Status: DC
  Administered 2015-09-14 – 2015-09-15 (×2): 1 via ORAL
  Filled 2015-09-14: qty 1

## 2015-09-14 MED ORDER — SIMETHICONE 80 MG PO CHEW
80.0000 mg | CHEWABLE_TABLET | ORAL | Status: DC
  Administered 2015-09-15 – 2015-09-16 (×2): 80 mg via ORAL
  Filled 2015-09-14 (×2): qty 1

## 2015-09-14 MED ORDER — FENTANYL CITRATE (PF) 100 MCG/2ML IJ SOLN
INTRAMUSCULAR | Status: AC
  Filled 2015-09-14: qty 2

## 2015-09-14 NOTE — Op Note (Signed)
[name], [name] NO.:  [id number]  MEDICAL RECORD NO.:  [id number]  LOCATION:  [facility]                          FACILITY:  [facility]  PHYSICIAN:  Teresa Partridge, MD   DATE OF BIRTH:  04/14/1986  DATE OF PROCEDURE:  09/13/2015 DATE OF DISCHARGE:  09/13/2015                              OPERATIVE REPORT   PREOPERATIVE DIAGNOSES: 1. Intrauterine pregnancy at 38-0/7th week status post SVD of baby A. 2. Arrest of descent and malpresentation of baby B. 3. Low-grade fever.  POSTOPERATIVE DIAGNOSES: 1. Arrest of descent. 2. Malpresentation and bilateral tubal ligation.  PROCEDURE:  Primary low transverse cesarean section with one-layer closure.  BTL via [employer].  SURGEON:  Teresa Partridge, MD  ASSISTANTS:  Clyde Lundborg, certified nurse medical assistant and medical student, Maisie Fus.  ANESTHESIA:  Epidural.  EBL:  850.  URINE:  700 clear.  BLOOD ADMINISTERED:  None.  DRAINS:  Foley catheter.  SPECIMEN:  Placenta x2 to pathology.  LOCAL:  None.  COMPLICATIONS:  None.  DISPOSITION:  To PACU hemodynamically stable.  FINDINGS:  Viable female infant, reassuring fetal status, LOT and nuchal cord x1.  Normal fallopian tubes and ovaries bilaterally.  Apgars pending.  Weight pending.  PROCEDURE IN DETAIL:  Ms.  Teresa Farley was in room 170.  Baby A delivered at 5:07 and after he delivered, Baby B was intact, but the head was well into the abdomen, probably 3 fingerbreadths above the symphysis pubis.  She pushed for a little bit, was unable to bring the baby down significantly.  She was rotated and Peanut was used.  She was then eventually AROM'ed, but she remained at a -3 station.  Ultrasound was performed prior to AROM and baby was transverse.  Due to lack of progression after 5 hours, it was decided to proceed with primary low transverse cesarean section.  The patient also had a low-grade fever of 100.8 and Unasyn was started.  Pitocin was also  going to allow for adequate contractions.  The patient was taken to the operating room.  Epidural anesthesia was optimized.  She was prepped and draped in normal sterile fashion.  Baby A's cord was clamped quite high in the vagina, was swept inside the vagina.  SCDs were on and operating.  She had received Unasyn approximately 3 hours prior to the C-section was called, gent and clinda was added.  Time-out was performed.  She was draped adequate anesthesia was confirmed.  Family member brought to the bedside.  Abdomen was marked. Pfannenstiel skin incision was made with a scalpel carried down to the underlying layer of the fascia with the Bovie.  Hemostat of the large vessels in the subcutaneous space were performed with the Bovie.  Fascia was incised in the midline with the Bovie and extended laterally.  There was some bleeding due to partial transection of the os the superficial fibers of the rectus muscle that was cauterized with a hemostat and Bovie.  The fascia was dissected off the rectus muscle sharply.  Rectus muscles were separated at the midline.  Peritoneum was identified and tented up with hemostats and entered sharply with the Metzenbaum scissors and stretched.  Intra abdomen was  palpated.  No adhesions were appreciated Alexis retractor was placed.  The bladder flap was developed with the Metzenbaum and [place]sey[place] scissors. Bladder was displaced.  A bladder flap was displaced posteriorly. Transverse incision was made with the scalpel and extended bluntly. Because it was a little thick, Allis clamp was grasped to tent up the uterus to not have any fetal injury.  The head was brought to the incision.  Fundal pressure was applied.  The head came out atraumatically.  Nose and mouth were suctioned.  Shoulders were delivered easily. Delayed cord clamping was not performed mother had platelets of 77.  We want to expedite hemostasis of the uterus,bleeding of the uterus.  The cord  was clamped x2 and cut.  Baby was handed to awaiting NICU team.  Both placentas were delivered were removed manually.  Uterus was cleared of all clots and debris with a moist laparotomy sponge x3.  Normal endometrial cavity noted.  The hysterotomy incision was then closed with 0 Vicryl in a continuous locked fashion.  There was a small extension on the right-hand side.  At this time, I was informed by the certified nurse midwife that the patient was for a tubal ligation of which she was aware. Tubal papers were signed in the office on March 13.  I discussed this with the patient's mother, who was at the bedside and the patient.  We had a discussion regarding the permanent nature and the alternatives.  The patient decided that she would like to proceed with bilateral tubal ligation.  Sponge was placed at teh lower uterine segment.  The left fallopian tube was grasped with a Babcock and was unable to exteriorize the uterus, as it was quite enlarged.  The fallopian tube was grasped with a Babcock clamp.  It was suture ligated with 0 plain gut x2 via Pomeroy method. The medial salpinx was then entered sharply with the Metzenbaum scissors and the portion of the fallopian tube was removed, lumen cauterized with the Bovie.  Same thing was done on the right-hand side.  Methergine was used after the placentas were delivered due to some increased bleeding.  Hemostasis was achieved with a few 0 Vicryl stitches on the hysterotomy incision.  Copious irrigation was performed of the gutters.  Of note, there was a moist laparotomy sponge that was placed on the left-hand side to push back the bowel and when we ligated the tube on the left- hand side.  That sponge was removed prior to irrigation.  The peritoneum was reapproximated with 2-0 Vicryl in continuous running fashion.  We inspected the fascia and the rectus muscle very closely for any bleeding prior to closure.  The fascia was then  reapproximated with 0 Vicryl in a continuous running fashion.  The subcutaneous base was not that deep, and we did want to cause any more bleeding.  We irrigated and cauterized with the Bovie.  Skin was reapproximated with 4-0 Monocryl in a subcuticular fashion.  Pressure dressing was applied.  All instrument, sponge, and needle counts were correct x3.  The patient tolerated the procedure well.  Baby remained in the OR suite.  Skin to skin with grandmother.  The patient was uncomfortable and received fentanyl and versed.     Teresa PartridgeEvelyn B Sae Handrich, MD     EBV/MEDQ  D:  09/14/2015  T:  09/14/2015  Job:  [id number]

## 2015-09-14 NOTE — Progress Notes (Signed)
Subjective: Postpartum Day 1: SVd baby A,  Cesarean Delivery baby B due to Arrest of descent and malpresentation Patient up ad lib, reports no syncope or dizziness. Feeding:  Bottle Contraceptive plan:  BTL  Objective: Vital signs in last 24 hours: Temp:  [97.7 F (36.5 C)-100.8 F (38.2 C)] 98.9 F (37.2 C) (04/17 0935) Pulse Rate:  [48-134] 51 (04/17 0935) Resp:  [15-30] 18 (04/17 0935) BP: (106-144)/(42-105) 113/45 mmHg (04/17 0935) SpO2:  [95 %-98 %] 97 % (04/17 0935)  Physical Exam:  General: alert and cooperative Lochia: appropriate Uterine Fundus: firm Abdomen:  + bowel sounds, non distended Incision: no significant drainage  Honeycomb dressing CDI DVT Evaluation: No evidence of DVT seen on physical exam. Homan's sign: Negative   Recent Labs  09/13/15 0130 09/13/15 1853 09/14/15 0512  HGB 10.6* 10.9* 9.9*  HCT 31.5* 33.2* 30.5*  WBC 6.2 10.4 14.2*   Orthostatics stable.  Assessment: Status post Cesarean section day 1. Doing well postoperatively.  Honeycomb dressing in place, no significant drainage Anemia - hemodynamicly stable.   Circumcision: out patient   Plan: Continue current care. Lactation consult for breast pumping help Iron supplements   Madysyn Hanken, CNM, MSN 09/14/2015. 11:49 AM

## 2015-09-14 NOTE — Brief Op Note (Signed)
09/13/2015 - 09/14/2015  12:39 AM  PATIENT:  Teresa Farley  30 y.o. female  PRE-OPERATIVE DIAGNOSIS:  IUP @ 38 0/7 weeks, S/p SVD Baby A,  Arrest of descent , malpresentation of Baby B, Low grade fever  POST-OPERATIVE DIAGNOSIS:  arrest of descent, malpresentation, bilateral tubal sterilization  PROCEDURE:  Procedure(s): CESAREAN SECTION (N/A), primary LTCS BTL via Pomeroy method  SURGEON:  Surgeon(s) and Role:    * Geryl RankinsEvelyn Quanita Barona, MD - Primary  PHYSICIAN ASSISTANT:   ASSISTANTS: Gerrit HeckJessica Emly, CNM.  Medical Student, Maisie Fushomas   ANESTHESIA:   epidural  EBL:  Total I/O In: 3000 [I.V.:3000] Out: 1550 [Urine:700; Blood:850]  BLOOD ADMINISTERED:none  DRAINS: Urinary Catheter (Foley)   LOCAL MEDICATIONS USED:  NONE  SPECIMEN:  Source of Specimen:  Placenta x 2  DISPOSITION OF SPECIMEN:  PATHOLOGY  COUNTS:  YES  TOURNIQUET:  * No tourniquets in log *  DICTATION: .Other Dictation: Dictation Number [id number]  PLAN OF CARE: Transfer to Postpartum after PACU.  PATIENT DISPOSITION:  PACU - hemodynamically stable.   Delay start of Pharmacological VTE agent (>24hrs) due to surgical blood loss or risk of bleeding: yes

## 2015-09-14 NOTE — Anesthesia Postprocedure Evaluation (Signed)
Anesthesia Post Note  Patient: Teresa Farley  Procedure(s) Performed: Procedure(s) (LRB): CESAREAN SECTION (N/A)  Patient location during evaluation: Mother Baby Anesthesia Type: Epidural Level of consciousness: awake and alert Pain management: pain level controlled Vital Signs Assessment: post-procedure vital signs reviewed and stable Respiratory status: spontaneous breathing, nonlabored ventilation and respiratory function stable Cardiovascular status: stable Postop Assessment: no headache, no backache and epidural receding Anesthetic complications: no Comments: Epidural will stay in until platelets higher.    Last Vitals:  Filed Vitals:   09/14/15 0435 09/14/15 0530  BP: 117/54 118/63  Pulse: 50 50  Temp: 36.5 C 36.7 C  Resp: 18 18    Last Pain:  Filed Vitals:   09/14/15 0558  PainSc: 0-No pain                 Yaasir Menken J

## 2015-09-14 NOTE — Addendum Note (Signed)
Addendum  created 09/14/15 [id number] by Elbert Ewingsolleen S Vartan Kerins, CRNA   Modules edited: Anesthesia LDA, Lines/Drains/Airways Properties Editor   Lines/Drains/Airways Properties Editor:  Properties of line/drain/airway/wound [REMOVED] Epidural Catheter have been modified.

## 2015-09-14 NOTE — Addendum Note (Signed)
Addendum  created 09/14/15 [id number] by Elbert Ewingsolleen S Nestor Wieneke, CRNA   Modules edited: Clinical Notes   Clinical Notes:  File: [id number]

## 2015-09-14 NOTE — Progress Notes (Signed)
Pt lightheaded and dizzy upon ambulating to BR. Back to bed with wheelchair with assistance of 3 RN. Foley left in and IV fluids LR at 125cc/hr running. Sherre ScarletKimberly Williams CNM called message left with L&D nurse due to in delivery.

## 2015-09-14 NOTE — Transfer of Care (Signed)
Immediate Anesthesia Transfer of Care Note  Patient: Teresa Farley  Procedure(s) Performed: Procedure(s): CESAREAN SECTION (N/A)  Patient Location: PACU  Anesthesia Type:Epidural  Level of Consciousness: sedated  Airway & Oxygen Therapy: Patient Spontanous Breathing  Post-op Assessment: Report given to RN and Post -op Vital signs reviewed and stable  Post vital signs: stable  Last Vitals:  Filed Vitals:   09/13/15 2246 09/13/15 2257  BP: 135/66 124/55  Pulse: 78 83  Temp:    Resp:      Complications: No apparent anesthesia complications

## 2015-09-14 NOTE — Anesthesia Postprocedure Evaluation (Signed)
Anesthesia Post Note  Patient: [name]  Procedure(s) Performed: Procedure(s) (LRB): CESAREAN SECTION (N/A)  Patient location during evaluation: Mother Baby Anesthesia Type: Epidural Level of consciousness: awake, awake and alert and oriented Pain management: pain level controlled Vital Signs Assessment: post-procedure vital signs reviewed and stable Respiratory status: spontaneous breathing, nonlabored ventilation and respiratory function stable Cardiovascular status: stable Postop Assessment: no headache, no backache, patient able to bend at knees, no signs of nausea or vomiting and adequate PO intake Anesthetic complications: no    Last Vitals:  Filed Vitals:   09/14/15 0435 09/14/15 0530  BP: 117/54 118/63  Pulse: 50 50  Temp: 36.5 C 36.7 C  Resp: 18 18    Last Pain:  Filed Vitals:   09/14/15 0558  PainSc: 0-No pain                 Vivion Romano

## 2015-09-15 ENCOUNTER — Encounter (HOSPITAL_COMMUNITY): Payer: Self-pay | Admitting: Obstetrics and Gynecology

## 2015-09-15 MED ORDER — SIMETHICONE 80 MG PO CHEW: 80.0000 mg | CHEWABLE_TABLET | ORAL | Status: DC

## 2015-09-15 MED ORDER — FERROUS SULFATE 325 (65 FE) MG PO TABS: 325.0000 mg | ORAL_TABLET | Freq: Two times a day (BID) | ORAL | Status: DC

## 2015-09-15 MED ORDER — OXYCODONE HCL 10 MG PO TABS: 10.0000 mg | ORAL_TABLET | ORAL | Status: DC | PRN

## 2015-09-15 MED ORDER — IBUPROFEN 600 MG PO TABS: 600.0000 mg | ORAL_TABLET | Freq: Four times a day (QID) | ORAL | Status: DC

## 2015-09-15 NOTE — Discharge Summary (Signed)
[facility] Discharge Summary   Patient Name:   Teresa Farley DOB:     [birth year] MRN:     [id number]  Date of Admission:   09/13/2015 Date of Discharge:  09/16/15  Admitting diagnosis:    induction 37 wks Principal Problem:   Delivery of twins, both live--Twin A vaginal delivery, Twin B cesarean Active Problems:   Twin gestation, dichorionic diamniotic  Term Pregnancy Delivered and Anemia    Discharge diagnosis:    induction 37 wks Principal Problem:   Delivery of twins, both live--Twin A vaginal delivery, Twin B cesarean Active Problems:   Twin gestation, dichorionic diamniotic  Term Pregnancy Delivered and Anemia                                                                     Post partum procedures: BTL  Type of Delivery:  Baby A SVD, Baby B CS  Delivering Provider:    Raiven, Belizaire [[id number]]  STALL, RACHEL   Disa, Riedlinger [[id number]]  Geryl Rankins   Date of Delivery:  09/14/15  Newborn Data:      Felicity, Penix [[id number]]  Live born female  Birth Weight: 8 lb 2.2 oz (3690 g) APGAR: 8, [address]   Shaletta, Hinostroza [[id number]]  Live born female  Birth Weight: 6 lb 15.6 oz (3165 g) APGAR: 8, 9  Baby's Name:  Adrianne and [place] Baby Feeding:   Bottle Disposition:   home with mother  Complications:   None  Hospital course:    IOL,, SVD and CS  Onset of Labor With Vaginal Delivery     30 y.o. yo [id number] at [redacted]w[redacted]d was admitted in Latent Labor on 09/13/2015. Patient had an uncomplicated labor course as follows:  Membrane Rupture Time/Date:    Shakala, Marlatt [[id number]]  10:23 AM   Adali, Pennings [[id number]]  8:39 PM ,   Amanat, Hackel [[id number]]  09/13/2015   Dashawna, Delbridge [[id number]]  09/13/2015   Intrapartum Procedures: Episiotomy:    Yuritza, Paulhus [[id number]]  None [1]   Kelda, Azad [[id number]]      Lacerations:     Ayva, Veilleux [[id number]]  None [1]   Pegge, Cumberledge [[id number]]     Patient had a delivery of two Viable infants.   Eleina, Jergens [[id number]]  09/13/2015   Shahla, Betsill [[id number]]  09/13/2015 Information for the patient's newborn:  Laury, Huizar [[id number]]  Delivery Method: Vaginal, Spontaneous Delivery (Filed from Delivery Summary) Information for the patient's newborn:  Katena, Petitjean [[id number]]  Delivery Method: C-Section, Low Transverse (Filed from Delivery Summary)    Pateint had an uncomplicated postpartum course.  She is ambulating, tolerating a regular diet, passing flatus, and urinating well. Patient is discharged home in stable condition on 09/15/2015.    Physical Exam:   Filed Vitals:   09/14/15 1800 09/14/15 2100 09/14/15 2125 09/15/15 0545  BP: 105/55  110/56 114/55  Pulse: 65 79 69 72  Temp: 98.3 F (36.8 C) 97.7 F (36.5 C)  97.9 F (36.6 C)  TempSrc: Oral Oral  Oral  Resp: 18 16 18 20   Height:      Weight:      SpO2: 99%  99%     General: alert Lochia: appropriate Uterine Fundus: firm Incision: Healing well with no significant drainage DVT Evaluation: No evidence of DVT seen on physical exam.  Labs: Lab Results  Component Value Date   WBC 14.2* 09/14/2015   HGB 9.9* 09/14/2015   HCT 30.5* 09/14/2015   MCV 85.4 09/14/2015   PLT 90* 09/14/2015   CMP Latest Ref Rng 09/13/2015  Glucose 65 - 99 mg/dL 79  BUN 6 - 20 mg/dL 7  Creatinine 4.09 - 8.11 mg/dL 9.14(N)  Sodium 829 - 562 mmol/L 135  Potassium 3.5 - 5.1 mmol/L 4.3  Chloride 101 - 111 mmol/L 104  CO2 22 - 32 mmol/L 25  Calcium 8.9 - 10.3 mg/dL 8.1(L)  Total Protein 6.5 - 8.1 g/dL 5.9(L)  Total Bilirubin 0.3 - 1.2 mg/dL 0.3  Alkaline Phos 38 - 126 U/L 157(H)  AST 15 - 41 U/L 20  ALT 14 - 54 U/L 11(L)    Discharge instruction: per After Visit Summary and "Baby and Me Booklet".  After Visit Meds:    Medication List      ASK your doctor about these medications        cyclobenzaprine 10 MG tablet  Commonly known as:  FLEXERIL  Take 1 tablet (10 mg total) by mouth 3 (three) times daily as needed for muscle spasms.     docusate sodium 100 MG capsule  Commonly known as:  COLACE  Take 1 capsule (100 mg total) by mouth 2 (two) times daily as needed for mild constipation.     ondansetron 4 MG tablet  Commonly known as:  ZOFRAN  Take 4 mg by mouth every 8 (eight) hours as needed for nausea or vomiting.     pantoprazole 20 MG tablet  Commonly known as:  PROTONIX  Take 1 tablet (20 mg total) by mouth daily.     prenatal multivitamin Tabs tablet  Take 1 tablet by mouth daily at 12 noon.        Diet: routine diet  Activity: Advance as tolerated. Pelvic rest for 6 weeks.   Outpatient follow up:6 weeks Follow up Appt:No future appointments. Follow up visit: No Follow-up on file.  Postpartum contraception: Tubal Ligation  09/16/15 Gumecindo Hopkin, CNM  Care After Cesarean Delivery  Refer to this sheet in the next few weeks. These instructions provide you with information on caring for yourself after your procedure. Your caregiver may also give you specific instructions. Your treatment has been planned according to current medical practices, but problems sometimes occur. Call your caregiver if you have any problems or questions after you go home. HOME CARE INSTRUCTIONS  Only take over-the-counter or prescription medicines as directed by your caregiver.  Do not drink alcohol, especially if you are breastfeeding or taking medicine to relieve pain.  Do not chew or smoke tobacco.  Continue to use good perineal care. Good perineal care includes:  Wiping your perineum from front to back.  Keeping your perineum clean.  Check your cut (incision) daily for increased redness, drainage, swelling, or separation of skin.  Clean your incision gently with soap and water every day, and then pat it dry.  If your caregiver says it is okay, leave the incision uncovered. Use a bandage (dressing) if the incision is draining fluid or appears irritated. If the adhesive strips across the incision do not fall off within 7 days, carefully peel them off.  Hug a pillow when coughing or sneezing until your incision is healed. This helps  to relieve pain.  Do not use tampons or douche until your caregiver says it is okay.  Shower, wash your hair, and take tub baths as directed by your caregiver.  Wear a well-fitting bra that provides breast support.  Limit wearing support panties or control-top hose.  Drink enough fluids to keep your urine clear or pale yellow.  Eat high-fiber foods such as whole grain cereals and breads, brown rice, beans, and fresh fruits and vegetables every day. These foods may help prevent or relieve constipation.  Resume activities such as climbing stairs, driving, lifting, exercising, or traveling as directed by your caregiver.  Talk to your caregiver about resuming sexual activities. This is dependent upon your risk of infection, your rate of healing, and your comfort and desire to resume sexual activity.  Try to have someone help you with your household activities and your newborn for at least a few days after you leave the hospital.  Rest as much as possible. Try to rest or take a nap when your newborn is sleeping.  Increase your activities gradually.  Keep all of your scheduled postpartum appointments. It is very important to keep your scheduled follow-up appointments. At these appointments, your caregiver will be checking to make sure that you are healing physically and emotionally. SEEK MEDICAL CARE IF:   You are passing large clots from your vagina. Save any clots to show your caregiver.  You have a foul smelling discharge from your vagina.  You have trouble urinating.  You are urinating frequently.  You have pain when you urinate.  You have a change in your  bowel movements.  You have increasing redness, pain, or swelling near your incision.  You have pus draining from your incision.  Your incision is separating.  You have painful, hard, or reddened breasts.  You have a severe headache.  You have blurred vision or see spots.  You feel sad or depressed.  You have thoughts of hurting yourself or your newborn.  You have questions about your care, the care of your newborn, or medicines.  You are dizzy or lightheaded.  You have a rash.  You have pain, redness, or swelling at the site of the removed intravenous access (IV) tube.  You have nausea or vomiting.  You stopped breastfeeding and have not had a menstrual period within 12 weeks of stopping.  You are not breastfeeding and have not had a menstrual period within 12 weeks of delivery.  You have a fever. SEEK IMMEDIATE MEDICAL CARE IF:  You have persistent pain.  You have chest pain.  You have shortness of breath.  You faint.  You have leg pain.  You have stomach pain.  Your vaginal bleeding saturates 2 or more sanitary pads in 1 hour. MAKE SURE YOU:   Understand these instructions.  Will watch your condition.  Will get help right away if you are not doing well or get worse. Document Released: 02/05/2002 Document Revised: 02/08/2012 Document Reviewed: 01/11/2012 [facility] Patient Information 2014 [place], [place].   Postpartum Depression and Baby Blues  The postpartum period begins right after the birth of a baby. During this time, there is often a great amount of joy and excitement. It is also a time of considerable changes in the life of the parent(s). Regardless of how many times a mother gives birth, each child brings new challenges and dynamics to the family. It is not unusual to have feelings of excitement accompanied by confusing shifts in moods, emotions, and thoughts. All  mothers are at risk of developing postpartum depression or the "baby blues." These  mood changes can occur right after giving birth, or they may occur many months after giving birth. The baby blues or postpartum depression can be mild or severe. Additionally, postpartum depression can resolve rather quickly, or it can be a long-term condition. CAUSES Elevated hormones and their rapid decline are thought to be a main cause of postpartum depression and the baby blues. There are a number of hormones that radically change during and after pregnancy. Estrogen and progesterone usually decrease immediately after delivering your baby. The level of thyroid hormone and various cortisol steroids also rapidly drop. Other factors that play a major role in these changes include major life events and genetics.  RISK FACTORS If you have any of the following risks for the baby blues or postpartum depression, know what symptoms to watch out for during the postpartum period. Risk factors that may increase the likelihood of getting the baby blues or postpartum depression include:  Havinga personal or family history of depression.  Having depression while being pregnant.  Having premenstrual or oral contraceptive-associated mood issues.  Having exceptional life stress.  Having marital conflict.  Lacking a social support network.  Having a baby with special needs.  Having health problems such as diabetes. SYMPTOMS Baby blues symptoms include:  Brief fluctuations in mood, such as going from extreme happiness to sadness.  Decreased concentration.  Difficulty sleeping.  Crying spells, tearfulness.  Irritability.  Anxiety. Postpartum depression symptoms typically begin within the first month after giving birth. These symptoms include:  Difficulty sleeping or excessive sleepiness.  Marked weight loss.  Agitation.  Feelings of worthlessness.  Lack of interest in activity or food. Postpartum psychosis is a very concerning condition and can be dangerous. Fortunately, it is rare.  Displaying any of the following symptoms is cause for immediate medical attention. Postpartum psychosis symptoms include:  Hallucinations and delusions.  Bizarre or disorganized behavior.  Confusion or disorientation. DIAGNOSIS  A diagnosis is made by an evaluation of your symptoms. There are no medical or lab tests that lead to a diagnosis, but there are various questionnaires that a caregiver may use to identify those with the baby blues, postpartum depression, or psychosis. Often times, a screening tool called the [place] Postnatal Depression Scale is used to diagnose depression in the postpartum period.  TREATMENT The baby blues usually goes away on its own in 1 to 2 weeks. Social support is often all that is needed. You should be encouraged to get adequate sleep and rest. Occasionally, you may be given medicines to help you sleep.  Postpartum depression requires treatment as it can last several months or longer if it is not treated. Treatment may include individual or group therapy, medicine, or both to address any social, physiological, and psychological factors that may play a role in the depression. Regular exercise, a healthy diet, rest, and social support may also be strongly recommended.  Postpartum psychosis is more serious and needs treatment right away. Hospitalization is often needed. HOME CARE INSTRUCTIONS  Get as much rest as you can. Nap when the baby sleeps.  Exercise regularly. Some women find yoga and walking to be beneficial.  Eat a balanced and nourishing diet.  Do little things that you enjoy. Have a cup of tea, take a bubble bath, read your favorite magazine, or listen to your favorite music.  Avoid alcohol.  Ask for help with household chores, cooking, grocery shopping, or running errands as  needed. Do not try to do everything.  Talk to people close to you about how you are feeling. Get support from your partner, family members, friends, or other new  moms.  Try to stay positive in how you think. Think about the things you are grateful for.  Do not spend a lot of time alone.  Only take medicine as directed by your caregiver.  Keep all your postpartum appointments.  Let your caregiver know if you have any concerns. SEEK MEDICAL CARE IF: You are having a reaction or problems with your medicine. SEEK IMMEDIATE MEDICAL CARE IF:  You have suicidal feelings.  You feel you may harm the baby or someone else. Document Released: 02/18/2004 Document Revised: 08/08/2011 Document Reviewed: 03/22/2011 [facility] Patient Information 2014 [place], [place].   Breastfeeding Deciding to breastfeed is one of the best choices you can make for you and your baby. A change in hormones during pregnancy causes your breast tissue to grow and increases the number and size of your milk ducts. These hormones also allow proteins, sugars, and fats from your blood supply to make breast milk in your milk-producing glands. Hormones prevent breast milk from being released before your baby is born as well as prompt milk flow after birth. Once breastfeeding has begun, thoughts of your baby, as well as his or her sucking or crying, can stimulate the release of milk from your milk-producing glands.  BENEFITS OF BREASTFEEDING For Your Baby  Your first milk (colostrum) helps your baby's digestive system function better.   There are antibodies in your milk that help your baby fight off infections.   Your baby has a lower incidence of asthma, allergies, and sudden infant death syndrome.   The nutrients in breast milk are better for your baby than infant formulas and are designed uniquely for your baby's needs.   Breast milk improves your baby's brain development.   Your baby is less likely to develop other conditions, such as childhood obesity, asthma, or type 2 diabetes mellitus.  For You   Breastfeeding helps to create a very special bond between you and your  baby.   Breastfeeding is convenient. Breast milk is always available at the correct temperature and costs nothing.   Breastfeeding helps to burn calories and helps you lose the weight gained during pregnancy.   Breastfeeding makes your uterus contract to its prepregnancy size faster and slows bleeding (lochia) after you give birth.   Breastfeeding helps to lower your risk of developing type 2 diabetes mellitus, osteoporosis, and breast or ovarian cancer later in life. SIGNS THAT YOUR BABY IS HUNGRY Early Signs of Hunger  Increased alertness or activity.  Stretching.  Movement of the head from side to side.  Movement of the head and opening of the mouth when the corner of the mouth or cheek is stroked (rooting).  Increased sucking sounds, smacking lips, cooing, sighing, or squeaking.  Hand-to-mouth movements.  Increased sucking of fingers or hands. Late Signs of Hunger  Fussing.  Intermittent crying. Extreme Signs of Hunger Signs of extreme hunger will require calming and consoling before your baby will be able to breastfeed successfully. Do not wait for the following signs of extreme hunger to occur before you initiate breastfeeding:   Restlessness.  A loud, strong cry.   Screaming.  BREASTFEEDING BASICS Breastfeeding Initiation  Find a comfortable place to sit or lie down, with your neck and back well supported.  Place a pillow or rolled up blanket under your baby to bring  him or her to the level of your breast (if you are seated). Nursing pillows are specially designed to help support your arms and your baby while you breastfeed.  Make sure that your baby's abdomen is facing your abdomen.   Gently massage your breast. With your fingertips, massage from your chest wall toward your nipple in a circular motion. This encourages milk flow. You may need to continue this action during the feeding if your milk flows slowly.  Support your breast with 4 fingers  underneath and your thumb above your nipple. Make sure your fingers are well away from your nipple and your baby's mouth.   Stroke your baby's lips gently with your finger or nipple.   When your baby's mouth is open wide enough, quickly bring your baby to your breast, placing your entire nipple and as much of the colored area around your nipple (areola) as possible into your baby's mouth.   More areola should be visible above your baby's upper lip than below the lower lip.   Your baby's tongue should be between his or her lower gum and your breast.   Ensure that your baby's mouth is correctly positioned around your nipple (latched). Your baby's lips should create a seal on your breast and be turned out (everted).  It is common for your baby to suck about 2-3 minutes in order to start the flow of breast milk. Latching Teaching your baby how to latch on to your breast properly is very important. An improper latch can cause nipple pain and decreased milk supply for you and poor weight gain in your baby. Also, if your baby is not latched onto your nipple properly, he or she may swallow some air during feeding. This can make your baby fussy. Burping your baby when you switch breasts during the feeding can help to get rid of the air. However, teaching your baby to latch on properly is still the best way to prevent fussiness from swallowing air while breastfeeding. Signs that your baby has successfully latched on to your nipple:    Silent tugging or silent sucking, without causing you pain.   Swallowing heard between every 3-4 sucks.    Muscle movement above and in front of his or her ears while sucking.  Signs that your baby has not successfully latched on to nipple:   Sucking sounds or smacking sounds from your baby while breastfeeding.  Nipple pain. If you think your baby has not latched on correctly, slip your finger into the corner of your baby's mouth to break the suction and place  it between your baby's gums. Attempt breastfeeding initiation again. Signs of Successful Breastfeeding Signs from your baby:   A gradual decrease in the number of sucks or complete cessation of sucking.   Falling asleep.   Relaxation of his or her body.   Retention of a small amount of milk in his or her mouth.   Letting go of your breast by himself or herself. Signs from you:  Breasts that have increased in firmness, weight, and size 1-3 hours after feeding.   Breasts that are softer immediately after breastfeeding.  Increased milk volume, as well as a change in milk consistency and color by the fifth day of breastfeeding.   Nipples that are not sore, cracked, or bleeding. Signs That Your Pecola Leisure is Getting Enough Milk  Wetting at least 3 diapers in a 24-hour period. The urine should be clear and pale yellow by age 49 days.  At  least 3 stools in a 24-hour period by age 869 days. The stool should be soft and yellow.  At least 3 stools in a 24-hour period by age 86 days. The stool should be seedy and yellow.  No loss of weight greater than 10% of birth weight during the first 583 days of age.  Average weight gain of 4-7 ounces (113-198 g) per week after age 45 days.  Consistent daily weight gain by age 869 days, without weight loss after the age of 2 weeks. After a feeding, your baby may spit up a small amount. This is common. BREASTFEEDING FREQUENCY AND DURATION Frequent feeding will help you make more milk and can prevent sore nipples and breast engorgement. Breastfeed when you feel the need to reduce the fullness of your breasts or when your baby shows signs of hunger. This is called "breastfeeding on demand." Avoid introducing a pacifier to your baby while you are working to establish breastfeeding (the first 4-6 weeks after your baby is born). After this time you may choose to use a pacifier. Research has shown that pacifier use during the first year of a baby's life decreases the  risk of sudden infant death syndrome (SIDS). Allow your baby to feed on each breast as long as he or she wants. Breastfeed until your baby is finished feeding. When your baby unlatches or falls asleep while feeding from the first breast, offer the second breast. Because newborns are often sleepy in the first few weeks of life, you may need to awaken your baby to get him or her to feed. Breastfeeding times will vary from baby to baby. However, the following rules can serve as a guide to help you ensure that your baby is properly fed:  Newborns (babies 434 weeks of age or younger) may breastfeed every 1-3 hours.  Newborns should not go longer than 3 hours during the day or 5 hours during the night without breastfeeding.  You should breastfeed your baby a minimum of 8 times in a 24-hour period until you begin to introduce solid foods to your baby at around 656 months of age. BREAST MILK PUMPING Pumping and storing breast milk allows you to ensure that your baby is exclusively fed your breast milk, even at times when you are unable to breastfeed. This is especially important if you are going back to work while you are still breastfeeding or when you are not able to be present during feedings. Your lactation consultant can give you guidelines on how long it is safe to store breast milk.  A breast pump is a machine that allows you to pump milk from your breast into a sterile bottle. The pumped breast milk can then be stored in a refrigerator or freezer. Some breast pumps are operated by hand, while others use electricity. Ask your lactation consultant which type will work best for you. Breast pumps can be purchased, but some hospitals and breastfeeding support groups lease breast pumps on a monthly basis. A lactation consultant can teach you how to hand express breast milk, if you prefer not to use a pump.  CARING FOR YOUR BREASTS WHILE YOU BREASTFEED Nipples can become dry, cracked, and sore while breastfeeding.  The following recommendations can help keep your breasts moisturized and healthy:  Avoid using soap on your nipples.   Wear a supportive bra. Although not required, special nursing bras and tank tops are designed to allow access to your breasts for breastfeeding without taking off your entire bra or  top. Avoid wearing underwire-style bras or extremely tight bras.  Air dry your nipples for 3-20minutes after each feeding.   Use only cotton bra pads to absorb leaked breast milk. Leaking of breast milk between feedings is normal.   Use lanolin on your nipples after breastfeeding. Lanolin helps to maintain your skin's normal moisture barrier. If you use pure lanolin, you do not need to wash it off before feeding your baby again. Pure lanolin is not toxic to your baby. You may also hand express a few drops of breast milk and gently massage that milk into your nipples and allow the milk to air dry. In the first few weeks after giving birth, some women experience extremely full breasts (engorgement). Engorgement can make your breasts feel heavy, warm, and tender to the touch. Engorgement peaks within 3-5 days after you give birth. The following recommendations can help ease engorgement:  Completely empty your breasts while breastfeeding or pumping. You may want to start by applying warm, moist heat (in the shower or with warm water-soaked hand towels) just before feeding or pumping. This increases circulation and helps the milk flow. If your baby does not completely empty your breasts while breastfeeding, pump any extra milk after he or she is finished.  Wear a snug bra (nursing or regular) or tank top for 1-2 days to signal your body to slightly decrease milk production.  Apply ice packs to your breasts, unless this is too uncomfortable for you.  Make sure that your baby is latched on and positioned properly while breastfeeding. If engorgement persists after 48 hours of following these  recommendations, contact your health care provider or a Advertising copywriter. OVERALL HEALTH CARE RECOMMENDATIONS WHILE BREASTFEEDING  Eat healthy foods. Alternate between meals and snacks, eating 3 of each per day. Because what you eat affects your breast milk, some of the foods may make your baby more irritable than usual. Avoid eating these foods if you are sure that they are negatively affecting your baby.  Drink milk, fruit juice, and water to satisfy your thirst (about 10 glasses a day).   Rest often, relax, and continue to take your prenatal vitamins to prevent fatigue, stress, and anemia.  Continue breast self-awareness checks.  Avoid chewing and smoking tobacco.  Avoid alcohol and drug use. Some medicines that may be harmful to your baby can pass through breast milk. It is important to ask your health care provider before taking any medicine, including all over-the-counter and prescription medicine as well as vitamin and herbal supplements. It is possible to become pregnant while breastfeeding. If birth control is desired, ask your health care provider about options that will be safe for your baby. SEEK MEDICAL CARE IF:   You feel like you want to stop breastfeeding or have become frustrated with breastfeeding.  You have painful breasts or nipples.  Your nipples are cracked or bleeding.  Your breasts are red, tender, or warm.  You have a swollen area on either breast.  You have a fever or chills.  You have nausea or vomiting.  You have drainage other than breast milk from your nipples.  Your breasts do not become full before feedings by the fifth day after you give birth.  You feel sad and depressed.  Your baby is too sleepy to eat well.  Your baby is having trouble sleeping.   Your baby is wetting less than 3 diapers in a 24-hour period.  Your baby has less than 3 stools in a 24-hour period.  Your baby's skin or the white part of his or her eyes becomes  yellow.   Your baby is not gaining weight by 48 days of age. SEEK IMMEDIATE MEDICAL CARE IF:   Your baby is overly tired (lethargic) and does not want to wake up and feed.  Your baby develops an unexplained fever. Document Released: 05/16/2005 Document Revised: 05/21/2013 Document Reviewed: 11/07/2012 [facility] Patient Information 2015 [place], [place]. This information is not intended to replace advice given to you by your health care provider. Make sure you discuss any questions you have with your health care provider.

## 2015-09-15 NOTE — Progress Notes (Signed)
Patient sat on side of bed and then ambulated to bathroom without dizziness or faintness.

## 2015-09-15 NOTE — Progress Notes (Addendum)
Subjective: Postpartum Day 2: SVD baby A, Cesarean Delivery baby B due to Arrest of descent and malpresentation Patient reports syncope or dizziness last night but has been up today without dificulty. Feeding:  bottle Contraceptive plan:  BTL  Objective: Vital signs in last 24 hours: Temp:  [97.7 F (36.5 C)-98.9 F (37.2 C)] 97.9 F (36.6 C) (04/18 0545) Pulse Rate:  [51-79] 72 (04/18 0545) Resp:  [15-20] 20 (04/18 0545) BP: (102-114)/(43-56) 114/55 mmHg (04/18 0545) SpO2:  [97 %-99 %] 99 % (04/17 2100)  Physical Exam:  General: alert and cooperative Lochia: appropriate Uterine Fundus: firm Abdomen:  + bowel sounds, non distended Incision: healing well, pressure and Honeycomb dressing CDI DVT Evaluation: No evidence of DVT seen on physical exam. Homan's sign: Negative   Recent Labs  09/13/15 0130 09/13/15 1853 09/14/15 0512  HGB 10.6* 10.9* 9.9*  HCT 31.5* 33.2* 30.5*  WBC 6.2 10.4 14.2*   Orthostatics stable.  Assessment: Status post Cesarean section day 2. Doing well postoperatively.  Honeycomb dressing in place, no significant drainage Anemia - hemodynamicly stable.   Circumcision: out patient   Plan: Continue current care. Plan for discharge tomorrow  DC foley and IV Pt up to shower Remove outer dressing today, change honeycomb after shower    Monti Jilek, CNM, MSN 09/15/2015. 8:13 AM

## 2015-09-17 LAB — TYPE AND SCREEN
ABO/RH(D): B POS
ANTIBODY SCREEN: NEGATIVE
UNIT DIVISION: 0
UNIT DIVISION: 0
Unit division: 0
Unit division: 0

## 2015-11-25 ENCOUNTER — Inpatient Hospital Stay (HOSPITAL_COMMUNITY)
Admission: AD | Admit: 2015-11-25 | Discharge: 2015-11-25 | Discharge status: Against Medical Advice | Payer: Medicaid Other | Source: Ambulatory Visit | Attending: Obstetrics and Gynecology | Admitting: Obstetrics and Gynecology

## 2015-11-25 DIAGNOSIS — N939 Abnormal uterine and vaginal bleeding, unspecified: Secondary | ICD-10-CM | POA: Insufficient documentation

## 2015-11-25 NOTE — MAU Note (Signed)
Urine sent to lab 

## 2015-11-25 NOTE — MAU Note (Signed)
Pt presents to MAU with complaints of heavy vaginal bleeding that started on Sunday June the 25th. Reports 1st cycle since delivery of twins on April the 16th. Baby A vaginal and Baby B C/S

## 2016-05-06 ENCOUNTER — Encounter (HOSPITAL_COMMUNITY): Payer: Self-pay | Admitting: *Deleted

## 2016-05-06 ENCOUNTER — Ambulatory Visit (HOSPITAL_COMMUNITY)
Admission: EM | Admit: 2016-05-06 | Discharge: 2016-05-06 | Disposition: A | Discharge status: Home / Self Care | Payer: No Typology Code available for payment source | Attending: Family Medicine | Admitting: Family Medicine

## 2016-05-06 DIAGNOSIS — R0789 Other chest pain: Secondary | ICD-10-CM

## 2016-05-06 MED ORDER — IBUPROFEN 600 MG PO TABS: 600.0000 mg | ORAL_TABLET | Freq: Four times a day (QID) | ORAL | 0 refills | Status: DC | PRN

## 2016-05-06 MED ORDER — CYCLOBENZAPRINE HCL 10 MG PO TABS: 10.0000 mg | ORAL_TABLET | Freq: Two times a day (BID) | ORAL | 0 refills | Status: DC | PRN

## 2016-05-06 MED ORDER — TRAMADOL HCL 50 MG PO TABS: 50.0000 mg | ORAL_TABLET | Freq: Four times a day (QID) | ORAL | 0 refills | Status: DC | PRN

## 2016-05-06 NOTE — Discharge Instructions (Signed)
°  Tramadol is strong pain medication. While taking, do not drink alcohol, drive, or perform any other activities that requires focus while taking these medications.  ° °Flexeril is a muscle relaxer and may cause drowsiness. Do not drink alcohol, drive, or operate heavy machinery while taking. ° °

## 2016-05-06 NOTE — ED Provider Notes (Signed)
CSN: [id number]     Arrival date & time 05/06/16  1412 History   First MD Initiated Contact with Patient 05/06/16 1432     Chief Complaint  Patient presents with  . Chest Pain   (Consider location/radiation/quality/duration/timing/severity/associated sxs/prior Treatment) HPI Teresa Farley is a 30 y.o. female presenting to UC with c/o severe sudden onset Left sided anterior rib pain that is worse with deep breath, cough, sneeze, movement, palpation or change in position.  She notes she does do a lot of heavy lifting at work but notes yesterday when pain started, she was not doing anything different at work. Denies known injury or recent fall. She notes cough is just occasionally to clear her throat. She otherwise feels well. Denies leg pain or swelling. No recent travel. She is not on birth control. Denies hx of blood clots. Denies rash or bruising to area of pain.    Past Medical History:  Diagnosis Date  . Abnormal Pap smear 2012  . Anemia   . Condyloma 02/23/2011   [facility]   . H/O varicella   . Irregular periods/menstrual cycles   . Low iron    Past Surgical History:  Procedure Laterality Date  . CESAREAN SECTION N/A 09/13/2015   Procedure: CESAREAN SECTION;  Surgeon: Geryl RankinsEvelyn Varnado, MD;  Location: [facility];  Service: Obstetrics;  Laterality: N/A;  . DILATION AND CURETTAGE OF UTERUS  june 2012   Family History  Problem Relation Age of Onset  . Breast cancer Maternal Grandmother   . Cancer Maternal Grandmother     OVARIAN   Social History  Substance Use Topics  . Smoking status: Former Smoker    Packs/day: 0.50    Years: 5.00    Types: Cigarettes    Quit date: 02/13/2015  . Smokeless tobacco: Never Used  . Alcohol use No     Comment: occassionally  LAST TIME USED- JULY  1 ST   OB History    Gravida Para Term Preterm AB Living   4 2 2  0 2 3   SAB TAB Ectopic Multiple Live Births   1 1 0 1 3      Review of Systems  Constitutional: Negative for chills, fatigue and fever.  Respiratory: Negative for cough, chest tightness, shortness of breath and wheezing.   Cardiovascular: Positive for chest pain. Negative for palpitations and leg swelling.  Musculoskeletal: Positive for myalgias (Left anterior ribcage ). Negative for arthralgias and back pain.  Skin: Negative for color change and rash.    Allergies  Patient has no known allergies.  Home Medications   Prior to Admission medications   Medication Sig Start Date End Date Taking? Authorizing Provider  Ibuprofen (MOTRIN IB PO) Take by mouth.   Yes Historical Provider, MD  cyclobenzaprine (FLEXERIL) 10 MG tablet Take 1 tablet (10 mg total) by mouth 3 (three) times daily as needed for muscle spasms. 08/02/15   Venus Standard, CNM  cyclobenzaprine (FLEXERIL) 10 MG tablet Take 1 tablet (10 mg total) by mouth 2 (two) times daily as needed for muscle spasms. 05/06/16   Junius FinnerErin O'Malley, PA-C  ferrous sulfate 325 (65 FE) MG tablet Take 1 tablet (325 mg total) by mouth 2 (two) times daily with a meal. 09/15/15   Venus Standard, CNM  ibuprofen (ADVIL,MOTRIN) 600 MG tablet Take 1 tablet (600 mg total) by mouth every 6 (six) hours. 09/15/15   Venus Standard, CNM  ibuprofen (ADVIL,MOTRIN) 600 MG tablet  Take 1 tablet (600 mg total) by mouth every 6 (six) hours as needed. 05/06/16   Junius FinnerErin O'Malley, PA-C  oxyCODONE 10 MG TABS Take 1 tablet (10 mg total) by mouth every 4 (four) hours as needed (pain scale > 7). 09/15/15   Venus Standard, CNM  Prenatal Vit-Fe Fumarate-FA (PRENATAL MULTIVITAMIN) TABS tablet Take 1 tablet by mouth daily at 12 noon.     Historical Provider, MD  simethicone (MYLICON) 80 MG chewable tablet Chew 1 tablet (80 mg total) by mouth daily. 09/15/15   Venus Standard, CNM  traMADol (ULTRAM) 50 MG tablet Take 1 tablet (50 mg total) by mouth every 6 (six) hours as needed. 05/06/16   Junius FinnerErin O'Malley, PA-C   Meds Ordered and Administered this Visit   Medications - No data to display  BP 126/70 (BP Location: Right Arm)   Pulse 82   Temp 98.6 F (37 C)   Resp 18   LMP 04/06/2016   SpO2 98%   Breastfeeding? No  No data found.   Physical Exam  Constitutional: She is oriented to person, place, and time. She appears well-developed and well-nourished. No distress.  HENT:  Head: Normocephalic and atraumatic.  Eyes: EOM are normal.  Neck: Normal range of motion. Neck supple.  Cardiovascular: Normal rate and regular rhythm.   Pulmonary/Chest: Effort normal and breath sounds normal. No respiratory distress. She has no wheezes. She has no rales. She exhibits tenderness.    Pt expressed having exacerbated pain why lying flat onto exam bed, guarded her Left side of chest.  Abdominal: Soft. She exhibits no distension. There is no tenderness.  Musculoskeletal: Normal range of motion. She exhibits no edema.  No midline spinal tenderness or tenderness to back.  Neurological: She is alert and oriented to person, place, and time.  Skin: Skin is warm and dry. No rash noted. She is not diaphoretic. No erythema.  Chest wall and back: skin in tact. No erythema or ecchymosis.   Psychiatric: She has a normal mood and affect. Her behavior is normal.  Nursing note and vitals reviewed.   Urgent Care Course   Clinical Course     Procedures (including critical care time)  Labs Review Labs Reviewed - No data to display  Imaging Review No results found.   MDM   1. Left-sided chest wall pain    Pt c/o Left anterior chest wall pain that is worse with movement and palpation. Pt does do a lot of heavy lifting at work. No known injury. PERC negative Chest pain is atypical for ACS. Doubt pneumonia. Pain likely from muscle strain in ribs.  Rx: Tramadol, flexeril, and ibuprofen F/u with PCP next week as needed. Discussed symptoms that warrant emergent care in the ED.     Junius Finnerrin O'Malley, PA-C 05/06/16 1455

## 2016-05-06 NOTE — ED Triage Notes (Signed)
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

## 2016-08-13 ENCOUNTER — Emergency Department (HOSPITAL_COMMUNITY)
Admission: EM | Admit: 2016-08-13 | Discharge: 2016-08-13 | Disposition: A | Discharge status: Home / Self Care | Payer: No Typology Code available for payment source | Attending: Emergency Medicine | Admitting: Emergency Medicine

## 2016-08-13 ENCOUNTER — Encounter (HOSPITAL_COMMUNITY): Payer: Self-pay | Admitting: Emergency Medicine

## 2016-08-13 DIAGNOSIS — M542 Cervicalgia: Secondary | ICD-10-CM | POA: Insufficient documentation

## 2016-08-13 DIAGNOSIS — Y929 Unspecified place or not applicable: Secondary | ICD-10-CM | POA: Insufficient documentation

## 2016-08-13 DIAGNOSIS — Y999 Unspecified external cause status: Secondary | ICD-10-CM | POA: Insufficient documentation

## 2016-08-13 DIAGNOSIS — Z87891 Personal history of nicotine dependence: Secondary | ICD-10-CM | POA: Insufficient documentation

## 2016-08-13 DIAGNOSIS — Y939 Activity, unspecified: Secondary | ICD-10-CM | POA: Insufficient documentation

## 2016-08-13 DIAGNOSIS — X58XXXA Exposure to other specified factors, initial encounter: Secondary | ICD-10-CM | POA: Insufficient documentation

## 2016-08-13 MED ORDER — KETOROLAC TROMETHAMINE 60 MG/2ML IM SOLN
60.0000 mg | Freq: Once | INTRAMUSCULAR | Status: AC
  Administered 2016-08-13: 60 mg via INTRAMUSCULAR
  Filled 2016-08-13: qty 2

## 2016-08-13 MED ORDER — METHOCARBAMOL 500 MG PO TABS
500.0000 mg | ORAL_TABLET | Freq: Once | ORAL | Status: AC
  Administered 2016-08-13: 500 mg via ORAL
  Filled 2016-08-13: qty 1

## 2016-08-13 MED ORDER — METHOCARBAMOL 500 MG PO TABS: 500.0000 mg | ORAL_TABLET | Freq: Three times a day (TID) | ORAL | 0 refills | Status: DC | PRN

## 2016-08-13 MED ORDER — NAPROXEN 500 MG PO TABS: 500.0000 mg | ORAL_TABLET | Freq: Two times a day (BID) | ORAL | 0 refills | Status: DC

## 2016-08-13 NOTE — ED Notes (Signed)
ED Provider at bedside. 

## 2016-08-13 NOTE — ED Triage Notes (Signed)
Patient here with complaints of neck pain and back pain. Reports neck pain that radiates down into back. "think I slept wrong" Ambulatory.

## 2016-08-13 NOTE — ED Provider Notes (Signed)
[facility]-EMERGENCY DEPT Provider Note   CSN: [id number] Arrival date & time: 08/13/16  1235  By signing my name below, I, Freida Busman, attest that this documentation has been prepared under the direction and in the presence of Mathews Robinsons, [place]. Electronically Signed: Freida Busman, Scribe. 08/13/2016. 1:14 PM.  History   Chief Complaint Chief Complaint  Patient presents with  . Back Pain  . Neck Pain    The history is provided by the patient. No language interpreter was used.   HPI Comments:  Teresa Farley is a 31 y.o. female who presents to the Emergency Department complaining of 10/10 upper back pain that radiates up into her posterior neck. She states she woke with a milder version of this pain 3 days ago but states pain worsened today. She denies lower back pain but notes an occasional pain that shoots down through her back and states this pain is accompanied by a tingling sensation in her lower extremities but she still has sensation in her extremities. Pt notes that sensation is similar to sciatica she had with past pregnancy. She has tried ibuprofen, applied ice packs/ heating pads, and taken ice baths with mild relief. Denies recent fall, MVC, or trauma. Pt has no other acute complaints or associated symptoms at this time.    Past Medical History:  Diagnosis Date  . Abnormal Pap smear 2012  . Anemia   . Condyloma 02/23/2011   Patient states that she is receiving treatment from [facility]   . H/O varicella   . Irregular periods/menstrual cycles   . Low iron     Patient Active Problem List   Diagnosis Date Noted  . Delivery of twins, both live--Twin A vaginal delivery, Twin B cesarean 09/14/2015  . Twin gestation, dichorionic diamniotic 09/13/2015  . Twins 03/20/2015  . Vaginal bleeding in pregnancy 03/20/2015  . Condyloma 02/23/2011    Past Surgical History:  Procedure Laterality Date  . CESAREAN SECTION N/A 09/13/2015   Procedure: CESAREAN  SECTION;  Surgeon: Geryl Rankins, MD;  Location: [facility] ORS;  Service: Obstetrics;  Laterality: N/A;  . DILATION AND CURETTAGE OF UTERUS  june 2012    OB History    Gravida Para Term Preterm AB Living   4 2 2  0 2 3   SAB TAB Ectopic Multiple Live Births   1 1 0 1 3       Home Medications    Prior to Admission medications   Medication Sig Start Date End Date Taking? Authorizing Provider  cyclobenzaprine (FLEXERIL) 10 MG tablet Take 1 tablet (10 mg total) by mouth 3 (three) times daily as needed for muscle spasms. 08/02/15   Venus Standard, CNM  cyclobenzaprine (FLEXERIL) 10 MG tablet Take 1 tablet (10 mg total) by mouth 2 (two) times daily as needed for muscle spasms. 05/06/16   Junius Finner, PA-C  ferrous sulfate 325 (65 FE) MG tablet Take 1 tablet (325 mg total) by mouth 2 (two) times daily with a meal. 09/15/15   Venus Standard, CNM  ibuprofen (ADVIL,MOTRIN) 600 MG tablet Take 1 tablet (600 mg total) by mouth every 6 (six) hours. 09/15/15   Venus Standard, CNM  ibuprofen (ADVIL,MOTRIN) 600 MG tablet Take 1 tablet (600 mg total) by mouth every 6 (six) hours as needed. 05/06/16   Junius Finner, PA-C  Ibuprofen (MOTRIN IB PO) Take by mouth.    Historical Provider, MD  methocarbamol (ROBAXIN) 500 MG tablet Take 1 tablet (500 mg total) by mouth every 8 (eight)  hours as needed for muscle spasms. 08/13/16   Georgiana ShoreJessica B Orly Quimby, PA-C  naproxen (NAPROSYN) 500 MG tablet Take 1 tablet (500 mg total) by mouth 2 (two) times daily with a meal. 08/13/16   Georgiana ShoreJessica B Jennica Tagliaferri, PA-C  oxyCODONE 10 MG TABS Take 1 tablet (10 mg total) by mouth every 4 (four) hours as needed (pain scale > 7). 09/15/15   Venus Standard, CNM  Prenatal Vit-Fe Fumarate-FA (PRENATAL MULTIVITAMIN) TABS tablet Take 1 tablet by mouth daily at 12 noon.     Historical Provider, MD  simethicone (MYLICON) 80 MG chewable tablet Chew 1 tablet (80 mg total) by mouth daily. 09/15/15   Venus Standard, CNM  traMADol (ULTRAM) 50 MG tablet Take 1 tablet (50  mg total) by mouth every 6 (six) hours as needed. 05/06/16   Junius FinnerErin O'Malley, PA-C    Family History Family History  Problem Relation Age of Onset  . Breast cancer Maternal Grandmother   . Cancer Maternal Grandmother     OVARIAN    Social History Social History  Substance Use Topics  . Smoking status: Former Smoker    Packs/day: 0.50    Years: 5.00    Types: Cigarettes    Quit date: 02/13/2015  . Smokeless tobacco: Never Used  . Alcohol use No     Comment: occassionally  LAST TIME USED- JULY  1 ST     Allergies   Patient has no known allergies.   Review of Systems Review of Systems  Constitutional: Negative for chills and fever.  HENT: Negative for ear pain.   Eyes: Negative for pain.  Respiratory: Negative for shortness of breath.   Cardiovascular: Negative for chest pain.  Gastrointestinal: Negative for abdominal pain, nausea and vomiting.  Genitourinary: Negative for difficulty urinating.  Musculoskeletal: Positive for back pain, myalgias and neck pain. Negative for arthralgias, gait problem and joint swelling.  Skin: Negative for color change, pallor, rash and wound.  Neurological: Negative for dizziness, syncope, facial asymmetry, speech difficulty, weakness, light-headedness, numbness and headaches.       +Paresthesias     Physical Exam Updated Vital Signs BP 123/60 (BP Location: Left Arm)   Pulse 68   Temp 98.7 F (37.1 C) (Oral)   Resp 16   SpO2 99%   Physical Exam  Constitutional: She is oriented to person, place, and time. She appears well-developed and well-nourished. No distress.  Patient is afebrile, non-toxic appearing, seating comfortably in chair in no acute distress.  HENT:  Head: Normocephalic and atraumatic.  Eyes: Conjunctivae and EOM are normal. Pupils are equal, round, and reactive to light.  Neck: Neck supple.  Cardiovascular: Normal rate, regular rhythm, normal heart sounds and intact distal pulses.   Pulmonary/Chest: Effort normal and  breath sounds normal. No respiratory distress. She has no wheezes. She has no rales. She exhibits no tenderness.  Abdominal: She exhibits no distension.  Musculoskeletal: She exhibits tenderness. She exhibits no edema or deformity.  Tender along the entire trapezius muscle Refuses to rotate neck to right or left due to pain No midline spinal tenderness   Neurological: She is alert and oriented to person, place, and time. No cranial nerve deficit or sensory deficit. She exhibits normal muscle tone. Coordination normal.  Neurologic Exam:   - Mental status: Patient is alert and cooperative. Fluent speech and words are clear. Coherent thought processes and insight is good. Patient is oriented x 4 to person, place, time and event.   - Cranial nerves:  CN III, IV,  VI: pupils equally round, reactive to light both direct and conscensual and normal accommodation. Full extra-ocular movement. CN V: motor temporalis and masseter strength intact. CN VII : muscles of facial expression intact. CN X :  midline uvula. XI strength of sternocleidomastoid and trapezius muscles 5/5, XII: tongue is midline when protruded.  - Motor: No involuntary movements. Muscle tone and bulk normal throughout. Muscle strength is 5/5 in bilateral shoulder abduction, elbow flexion and extension, wrist flexion and extension, thumb opposition, grip, hip extension, flexion, leg flexion and extension, ankle dorsiflexion and plantar flexion.   - Sensory: Proprioception, light tough sensation intact in all extremities.    Normal stance and gait    Skin: Skin is warm and dry. Capillary refill takes less than 2 seconds. No rash noted. She is not diaphoretic. No erythema. No pallor.  Psychiatric: She has a normal mood and affect. Her behavior is normal.  Nursing note and vitals reviewed.    ED Treatments / Results  DIAGNOSTIC STUDIES:  Oxygen Saturation is 99% on RA, normal by my interpretation.    COORDINATION OF CARE:  1:11 PM  Discussed treatment plan with pt at bedside and pt agreed to plan.  Labs (all labs ordered are listed, but only abnormal results are displayed) Labs Reviewed - No data to display  EKG  EKG Interpretation None       Radiology No results found.  Procedures Procedures (including critical care time)  Medications Ordered in ED Medications  methocarbamol (ROBAXIN) tablet 500 mg (500 mg Oral Given 08/13/16 1405)  ketorolac (TORADOL) injection 60 mg (60 mg Intramuscular Given 08/13/16 1406)     Initial Impression / Assessment and Plan / ED Course  I have reviewed the triage vital signs and the nursing notes.  Pertinent labs & imaging results that were available during my care of the patient were reviewed by me and considered in my medical decision making (see chart for details).     Patient with upper back pain and neck pain.  No neurological deficits and normal neuro exam.  Patient is ambulatory.  No loss of bowel or bladder control.  No concern for cauda equina.  No fever, night sweats, weight loss, h/o cancer, IVDA, no recent procedure to back. Supportive care  discussed. Appears safe for discharge at this time. Follow up as indicated in discharge paperwork.   Pain was managed in ED. She is non-toxic appearing and afebrile. Patient's pain significantly improved while in ED and she had full range of motion of her neck on reassessment.  Discussed strict return precautions and advised to return to the emergency department if experiencing any new or worsening symptoms. Instructions were understood and patient agreed with discharge plan.   Final Clinical Impressions(s) / ED Diagnoses   Final diagnoses:  Neck pain    New Prescriptions Discharge Medication List as of 08/13/2016  2:38 PM    START taking these medications   Details  methocarbamol (ROBAXIN) 500 MG tablet Take 1 tablet (500 mg total) by mouth every 8 (eight) hours as needed for muscle spasms., Starting Sat 08/13/2016,  Print    naproxen (NAPROSYN) 500 MG tablet Take 1 tablet (500 mg total) by mouth 2 (two) times daily with a meal., Starting Sat 08/13/2016, Print       I personally performed the services described in this documentation, which was scribed in my presence. The recorded information has been reviewed and is accurate.     Georgiana Shore, PA-C 08/13/16 1925  Lavera Guise, MD 08/14/16 [phone]

## 2016-08-13 NOTE — Discharge Instructions (Signed)
As discussed, use robaxin as needed for muscle spasm. Do not drive or operate machinery while on this medication. You may use ice/heat, stretching and range of motion exercises. Follow up with your primary care provider next week. Return to the Emergency department if you experience worsening or new concerning symptoms.

## 2017-03-09 ENCOUNTER — Emergency Department (HOSPITAL_COMMUNITY)
Admission: EM | Admit: 2017-03-09 | Discharge: 2017-03-09 | Disposition: A | Discharge status: Home / Self Care | Payer: Medicaid Other | Attending: Emergency Medicine | Admitting: Emergency Medicine

## 2017-03-09 ENCOUNTER — Encounter (HOSPITAL_COMMUNITY): Payer: Self-pay | Admitting: *Deleted

## 2017-03-09 ENCOUNTER — Emergency Department (HOSPITAL_COMMUNITY): Payer: Medicaid Other

## 2017-03-09 DIAGNOSIS — Z87891 Personal history of nicotine dependence: Secondary | ICD-10-CM | POA: Diagnosis not present

## 2017-03-09 DIAGNOSIS — Z79899 Other long term (current) drug therapy: Secondary | ICD-10-CM | POA: Insufficient documentation

## 2017-03-09 DIAGNOSIS — J020 Streptococcal pharyngitis: Secondary | ICD-10-CM | POA: Insufficient documentation

## 2017-03-09 DIAGNOSIS — J029 Acute pharyngitis, unspecified: Secondary | ICD-10-CM | POA: Diagnosis present

## 2017-03-09 LAB — BASIC METABOLIC PANEL
Anion gap: 10 (ref 5–15)
BUN: <5 mg/dL — ABNORMAL LOW (ref 6–20)
CO2: 26 mmol/L (ref 22–32)
Calcium: 9.5 mg/dL (ref 8.9–10.3)
Chloride: 103 mmol/L (ref 101–111)
Creatinine, Ser: 0.61 mg/dL (ref 0.44–1.00)
GFR calc Af Amer: >60 mL/min (ref >60)
GFR calc non Af Amer: >60 mL/min (ref >60)
Glucose, Bld: 111 mg/dL — ABNORMAL HIGH (ref 65–99)
Potassium: 3.9 mmol/L (ref 3.5–5.1)
Sodium: 139 mmol/L (ref 135–145)

## 2017-03-09 LAB — CBC WITH DIFFERENTIAL/PLATELET
Basophils Absolute: 0 10*3/uL (ref 0.0–0.1)
Basophils Relative: 0 %
Eosinophils Absolute: 0.1 10*3/uL (ref 0.0–0.7)
Eosinophils Relative: 1 %
HCT: 46.2 % — ABNORMAL HIGH (ref 36.0–46.0)
Hemoglobin: 15.3 g/dL — ABNORMAL HIGH (ref 12.0–15.0)
Lymphocytes Relative: 7 %
Lymphs Abs: 1.1 10*3/uL (ref 0.7–4.0)
MCH: 29.7 pg (ref 26.0–34.0)
MCHC: 33.1 g/dL (ref 30.0–36.0)
MCV: 89.5 fL (ref 78.0–100.0)
Monocytes Absolute: 0.9 10*3/uL (ref 0.1–1.0)
Monocytes Relative: 6 %
Neutro Abs: 13.1 10*3/uL — ABNORMAL HIGH (ref 1.7–7.7)
Neutrophils Relative %: 86 %
Platelets: 204 10*3/uL (ref 150–400)
RBC: 5.16 MIL/uL — ABNORMAL HIGH (ref 3.87–5.11)
RDW: 12.9 % (ref 11.5–15.5)
WBC: 15.2 10*3/uL — ABNORMAL HIGH (ref 4.0–10.5)

## 2017-03-09 LAB — RAPID STREP SCREEN (MED CTR MEBANE ONLY): Streptococcus, Group A Screen (Direct): POSITIVE — AB

## 2017-03-09 MED ORDER — IOPAMIDOL (ISOVUE-300) INJECTION 61%
INTRAVENOUS | Status: AC
  Filled 2017-03-09: qty 75

## 2017-03-09 MED ORDER — DEXTROSE 5 % IV SOLN
1.0000 g | Freq: Once | INTRAVENOUS | Status: AC
  Administered 2017-03-09: 1 g via INTRAVENOUS
  Filled 2017-03-09: qty 10

## 2017-03-09 MED ORDER — HYDROCODONE-ACETAMINOPHEN 7.5-325 MG/15ML PO SOLN: 10.0000 mL | Freq: Four times a day (QID) | ORAL | 0 refills | Status: DC | PRN

## 2017-03-09 MED ORDER — KETOROLAC TROMETHAMINE 15 MG/ML IJ SOLN
15.0000 mg | Freq: Once | INTRAMUSCULAR | Status: AC
  Administered 2017-03-09: 15 mg via INTRAVENOUS
  Filled 2017-03-09: qty 1

## 2017-03-09 MED ORDER — DEXAMETHASONE SODIUM PHOSPHATE 10 MG/ML IJ SOLN
10.0000 mg | Freq: Once | INTRAMUSCULAR | Status: AC
Start: 2017-03-09 — End: 2017-03-09
  Administered 2017-03-09: 10 mg via INTRAVENOUS
  Filled 2017-03-09: qty 1

## 2017-03-09 MED ORDER — SODIUM CHLORIDE 0.9 % IV BOLUS (SEPSIS)
1000.0000 mL | Freq: Once | INTRAVENOUS | Status: AC
  Administered 2017-03-09: 1000 mL via INTRAVENOUS

## 2017-03-09 MED ORDER — HYDROMORPHONE HCL 1 MG/ML IJ SOLN
1.0000 mg | Freq: Once | INTRAMUSCULAR | Status: AC
  Administered 2017-03-09: 1 mg via INTRAVENOUS
  Filled 2017-03-09: qty 1

## 2017-03-09 MED ORDER — PENICILLIN V POTASSIUM 250 MG/5ML PO SOLR: 500.0000 mg | Freq: Three times a day (TID) | ORAL | 0 refills | Status: AC

## 2017-03-09 NOTE — ED Notes (Signed)
Pt talking to family member and texting on phone. She stated the medicine hasn't helped her pain at all.

## 2017-03-09 NOTE — ED Notes (Signed)
Pt states it hurts to breathe, talk, swallow, states she cant swallow her spit

## 2017-03-09 NOTE — ED Triage Notes (Signed)
Pt has been gagging and hard to breath and throat hurts to talk, reports drooling since yesterday, Tonsils and uvula seem swollen with little open area seen

## 2017-03-09 NOTE — ED Notes (Signed)
Pt to CT

## 2017-03-09 NOTE — ED Provider Notes (Signed)
[facility]-EMERGENCY DEPT Provider Note   CSN: [id number] Arrival date & time: 03/09/17  0810     History   Chief Complaint Chief Complaint  Patient presents with  . Sore Throat    HPI Teresa Farley is a 31 y.o. female.  HPI   31 year old female with sore throat/neck pain. Gradual onset 2 days ago. Progressively worsening. Pain is worse with swallowing. Sometimes she gags when she tries to talk. She reports that at times she has some difficulty swallowing her saliva. No fevers. No other acute complaints.former smoker. Denies diabetes or other known immunocompromising state.  Past Medical History:  Diagnosis Date  . Abnormal Pap smear 2012  . Anemia   . Condyloma 02/23/2011   Patient states that she is receiving treatment from [facility]   . H/O varicella   . Irregular periods/menstrual cycles   . Low iron     Patient Active Problem List   Diagnosis Date Noted  . Delivery of twins, both live--Twin A vaginal delivery, Twin B cesarean 09/14/2015  . Twin gestation, dichorionic diamniotic 09/13/2015  . Twins 03/20/2015  . Vaginal bleeding in pregnancy 03/20/2015  . Condyloma 02/23/2011    Past Surgical History:  Procedure Laterality Date  . CESAREAN SECTION N/A 09/13/2015   Procedure: CESAREAN SECTION;  Surgeon: Geryl Rankins, MD;  Location: [facility];  Service: Obstetrics;  Laterality: N/A;  . DILATION AND CURETTAGE OF UTERUS  june 2012    OB History    Gravida Para Term Preterm AB Living   0 2 3   SAB TAB Ectopic Multiple Live Births   1 1 0 1 3       Home Medications    Prior to Admission medications   Medication Sig Start Date End Date Taking? Authorizing Provider  ibuprofen (ADVIL,MOTRIN) 200 MG tablet Take 600 mg by mouth every 6 (six) hours as needed for headache or mild pain.   Yes [provider]  cyclobenzaprine (FLEXERIL) 10 MG tablet Take 1 tablet (10 mg total) by mouth 3 (three) times daily as needed for muscle spasms.  08/02/15   Standard, Venus, CNM  cyclobenzaprine (FLEXERIL) 10 MG tablet Take 1 tablet (10 mg total) by mouth 2 (two) times daily as needed for muscle spasms. 05/06/16   Lurene Shadow, PA-C  ferrous sulfate 325 (65 FE) MG tablet Take 1 tablet (325 mg total) by mouth 2 (two) times daily with a meal. 09/15/15   Standard, Venus, CNM  ibuprofen (ADVIL,MOTRIN) 600 MG tablet Take 1 tablet (600 mg total) by mouth every 6 (six) hours. 09/15/15   Standard, Venus, CNM  ibuprofen (ADVIL,MOTRIN) 600 MG tablet Take 1 tablet (600 mg total) by mouth every 6 (six) hours as needed. 05/06/16   Lurene Shadow, PA-C  Ibuprofen (MOTRIN IB PO) Take by mouth.    [provider]  methocarbamol (ROBAXIN) 500 MG tablet Take 1 tablet (500 mg total) by mouth every 8 (eight) hours as needed for muscle spasms. 08/13/16   Mathews Robinsons B, PA-C  naproxen (NAPROSYN) 500 MG tablet Take 1 tablet (500 mg total) by mouth 2 (two) times daily with a meal. 08/13/16   Mathews Robinsons B, PA-C  oxyCODONE 10 MG TABS Take 1 tablet (10 mg total) by mouth every 4 (four) hours as needed (pain scale > 7). 09/15/15   Standard, Venus, CNM  Prenatal Vit-Fe Fumarate-FA (PRENATAL MULTIVITAMIN) TABS tablet Take 1 tablet by mouth daily at 12 noon.  [provider]  simethicone (MYLICON) 80 MG chewable tablet Chew 1 tablet (80 mg total) by mouth daily. 09/15/15   Standard, Venus, CNM  traMADol (ULTRAM) 50 MG tablet Take 1 tablet (50 mg total) by mouth every 6 (six) hours as needed. 05/06/16   Lurene Shadow, PA-C    Family History Family History  Problem Relation Age of Onset  . Breast cancer Maternal Grandmother   . Cancer Maternal Grandmother        OVARIAN    Social History Social History  Substance Use Topics  . Smoking status: Former Smoker    Packs/day: 0.50    Years: 5.00    Types: Cigarettes    Quit date: 02/13/2015  . Smokeless tobacco: Never Used  . Alcohol use No     Comment: occassionally  LAST TIME USED- JULY  1  ST     Allergies   Patient has no known allergies.   Review of Systems Review of Systems  All systems reviewed and negative, other than as noted in HPI.  Physical Exam Updated Vital Signs BP 107/70   Pulse 89   Temp 99.1 F (37.3 C) (Oral)   Resp 18   LMP 02/20/2017 (Approximate)   SpO2 96%   Physical Exam  Constitutional: She appears well-developed and well-nourished. No distress.  HENT:  Head: Normocephalic and atraumatic.  Sitting forward in bed. She is not drooling. No pooling of secretions. No trismus. Soft palate petechiae. Uvula is somewhat edematous. Midline. Muffled voice. Diffuse anterior neck tenderness. No stridor.  Eyes: Conjunctivae are normal. Right eye exhibits no discharge. Left eye exhibits no discharge.  Neck: Neck supple.  Cardiovascular: Regular rhythm and normal heart sounds.  Exam reveals no gallop and no friction rub.   No murmur heard. Mild tachycardia  Pulmonary/Chest: Effort normal and breath sounds normal. No respiratory distress.  Abdominal: Soft. She exhibits no distension. There is no tenderness.  Musculoskeletal: She exhibits no edema or tenderness.  Neurological: She is alert.  Skin: Skin is warm and dry.  Psychiatric: She has a normal mood and affect. Her behavior is normal. Thought content normal.  Nursing note and vitals reviewed.    ED Treatments / Results  Labs (all labs ordered are listed, but only abnormal results are displayed) Labs Reviewed  RAPID STREP SCREEN (NOT AT [facility]) - Abnormal; Notable for the following:       Result Value   Streptococcus, Group A Screen (Direct) POSITIVE (*)    All other components within normal limits  CBC WITH DIFFERENTIAL/PLATELET - Abnormal; Notable for the following:    WBC 15.2 (*)    RBC 5.16 (*)    Hemoglobin 15.3 (*)    HCT 46.2 (*)    Neutro Abs 13.1 (*)    All other components within normal limits  BASIC METABOLIC PANEL - Abnormal; Notable for the following:    Glucose, Bld 111  (*)    BUN <5 (*)    All other components within normal limits    EKG  EKG Interpretation None       Radiology No results found.  Procedures Procedures (including critical care time)  Medications Ordered in ED Medications  iopamidol (ISOVUE-300) 61 % injection (not administered)  sodium chloride 0.9 % bolus 1,000 mL (1,000 mLs Intravenous New Bag/Given 03/09/17 0912)  ketorolac (TORADOL) 15 MG/ML injection 15 mg (15 mg Intravenous Given 03/09/17 0912)  dexamethasone (DECADRON) injection 10 mg (10 mg Intravenous Given 03/09/17 0912)  HYDROmorphone (DILAUDID) injection  1 mg (1 mg Intravenous Given 03/09/17 0912)     Initial Impression / Assessment and Plan / ED Course  I have reviewed the triage vital signs and the nursing notes.  Pertinent labs & imaging results that were available during my care of the patient were reviewed by me and considered in my medical decision making (see chart for details).     31yF with sore throat. I was more concerned when she first presented, but now suspect some associated anxiety. She appears more relaxed and is now speaking. Voice doesn't sounds muffled.  Sitting back. No spitting.  Initially given rocephin but rapid strep did come back positive. Will DC with PRN pain meds and PCN assuming CT doesn't show anything significantly concerning. .   CT w/o drainable collection or significant airway compromise.   Final Clinical Impressions(s) / ED Diagnoses   Final diagnoses:  Strep pharyngitis    New Prescriptions New Prescriptions   No medications on file     Raeford Razor, MD 03/09/17 1130

## 2017-06-01 DIAGNOSIS — L700 Acne vulgaris: Secondary | ICD-10-CM | POA: Diagnosis not present

## 2018-08-13 ENCOUNTER — Emergency Department (HOSPITAL_COMMUNITY)
Admission: EM | Admit: 2018-08-13 | Discharge: 2018-08-13 | Disposition: A | Discharge status: Home / Self Care | Payer: Medicaid Other | Attending: Emergency Medicine | Admitting: Emergency Medicine

## 2018-08-13 ENCOUNTER — Encounter (HOSPITAL_COMMUNITY): Payer: Self-pay

## 2018-08-13 ENCOUNTER — Other Ambulatory Visit: Payer: Self-pay

## 2018-08-13 DIAGNOSIS — J020 Streptococcal pharyngitis: Secondary | ICD-10-CM | POA: Diagnosis not present

## 2018-08-13 DIAGNOSIS — Z87891 Personal history of nicotine dependence: Secondary | ICD-10-CM | POA: Insufficient documentation

## 2018-08-13 DIAGNOSIS — J029 Acute pharyngitis, unspecified: Secondary | ICD-10-CM | POA: Diagnosis present

## 2018-08-13 LAB — GROUP A STREP BY PCR: Group A Strep by PCR: DETECTED — AB

## 2018-08-13 MED ORDER — ACETAMINOPHEN 325 MG PO TABS
650.0000 mg | ORAL_TABLET | Freq: Once | ORAL | Status: AC
  Administered 2018-08-13: 650 mg via ORAL
  Filled 2018-08-13: qty 2

## 2018-08-13 MED ORDER — PENICILLIN G BENZATHINE 1200000 UNIT/2ML IM SUSP
1.2000 10*6.[IU] | Freq: Once | INTRAMUSCULAR | Status: AC
  Administered 2018-08-13: 1.2 10*6.[IU] via INTRAMUSCULAR
  Filled 2018-08-13: qty 2

## 2018-08-13 MED ORDER — DEXAMETHASONE SODIUM PHOSPHATE 10 MG/ML IJ SOLN
10.0000 mg | Freq: Once | INTRAMUSCULAR | Status: AC
  Administered 2018-08-13: 10 mg via INTRAMUSCULAR
  Filled 2018-08-13: qty 1

## 2018-08-13 NOTE — ED Notes (Signed)
Bed: WA04 Expected date:  Expected time:  Means of arrival:  Comments: 

## 2018-08-13 NOTE — ED Triage Notes (Signed)
Patient c/o sore throat and body aches since last night.

## 2018-08-13 NOTE — ED Provider Notes (Signed)
[facility]-EMERGENCY DEPT Provider Note   CSN: [id number] Arrival date & time: 08/13/18  8502    History   Chief Complaint Chief Complaint  Patient presents with  . Sore Throat    HPI Teresa Farley is a 33 y.o. female with a hx of iron deficiency, tobacco abuse, and prior tubal ligation who presents to the ER with complaints of sore throat that began last evening. Patient notes sore throat, nasal congestion, fullness sensation to bilateral ears, chills, & generalized body aches. She thought she may have had a fever, but had not checked temperature at home. Sore throat is a 10/10 in severity, worse with swallowing, but able to swallow, no alleviating factors, no intervention PTA. Denies cough, dyspnea, chest pain, emesis, abdominal pain, or diarrhea. Denies chance of pregnancy. No recent travel or covid 19 exposures or interaction w/ sick contacts. She notes sxs feel similar to prior strep.      HPI  Past Medical History:  Diagnosis Date  . Abnormal Pap smear 2012  . Anemia   . Condyloma 02/23/2011   Patient states that she is receiving treatment from [facility]   . H/O varicella   . Irregular periods/menstrual cycles   . Low iron     Patient Active Problem List   Diagnosis Date Noted  . Delivery of twins, both live--Twin A vaginal delivery, Twin B cesarean 09/14/2015  . Twin gestation, dichorionic diamniotic 09/13/2015  . Twins 03/20/2015  . Vaginal bleeding in pregnancy 03/20/2015  . Condyloma 02/23/2011    Past Surgical History:  Procedure Laterality Date  . CESAREAN SECTION N/A 09/13/2015   Procedure: CESAREAN SECTION;  Surgeon: Geryl Rankins, MD;  Location: [facility] ORS;  Service: Obstetrics;  Laterality: N/A;  . DILATION AND CURETTAGE OF UTERUS  june 2012     OB History    Gravida  4   Para  2   Term  2   Preterm  0   AB  2   Living  3     SAB  1   TAB  1   Ectopic  0   Multiple  1   Live Births  3             Home Medications    Prior to Admission medications   Medication Sig Start Date End Date Taking? Authorizing Provider  HYDROcodone-acetaminophen (HYCET) 7.5-325 mg/15 ml solution Take 10 mLs by mouth every 6 (six) hours as needed for moderate pain. 03/09/17   Raeford Razor, MD  ibuprofen (ADVIL,MOTRIN) 200 MG tablet Take 600 mg by mouth every 6 (six) hours as needed for headache or mild pain.    [provider]  ibuprofen (ADVIL,MOTRIN) 600 MG tablet Take 1 tablet (600 mg total) by mouth every 6 (six) hours. 09/15/15   Standard, Venus, CNM  Prenatal Vit-Fe Fumarate-FA (PRENATAL MULTIVITAMIN) TABS tablet Take 1 tablet by mouth daily at 12 noon.     [provider]    Family History Family History  Problem Relation Age of Onset  . Breast cancer Maternal Grandmother   . Cancer Maternal Grandmother        OVARIAN    Social History Social History   Tobacco Use  . Smoking status: Former Smoker    Packs/day: 0.50    Years: 5.00    Pack years: 2.50    Types: Cigarettes    Last attempt to quit: 02/13/2015    Years since quitting: 3.4  . Smokeless tobacco:  Never Used  Substance Use Topics  . Alcohol use: Yes    Alcohol/week: 0.0 standard drinks    Comment: occasionally  . Drug use: No     Allergies   Patient has no known allergies.   Review of Systems Review of Systems  Constitutional: Positive for chills and fever (subjective).  HENT: Positive for congestion, ear pain ("fullness"), sore throat and trouble swallowing (painful but able). Negative for drooling and voice change.   Respiratory: Negative for cough and shortness of breath.   Cardiovascular: Negative for chest pain.  Gastrointestinal: Negative for abdominal pain, diarrhea, nausea and vomiting.  Musculoskeletal: Positive for myalgias (generalized).     Physical Exam Updated Vital Signs BP (!) 143/81 (BP Location: Right Arm)   Pulse (!) 105   Temp 98.2 F (36.8 C) (Oral)   Resp 15    Ht  (1.702 m)   Wt 86.2 kg   LMP 07/15/2018 (Approximate)   SpO2 100%   BMI 29.76 kg/m   Physical Exam Vitals signs and nursing note reviewed.  Constitutional:      General: She is not in acute distress.    Appearance: She is well-developed.  HENT:     Head: Normocephalic and atraumatic.     Right Ear: Ear canal normal. Tympanic membrane is not perforated, erythematous, retracted or bulging.     Left Ear: Ear canal normal. Tympanic membrane is not perforated, erythematous, retracted or bulging.     Ears:     Comments: No mastoid erythema/swelling/tenderness.     Nose:     Right Sinus: No maxillary sinus tenderness or frontal sinus tenderness.     Left Sinus: No maxillary sinus tenderness or frontal sinus tenderness.     Mouth/Throat:     Pharynx: Uvula midline. Oropharyngeal exudate (minimal) and posterior oropharyngeal erythema present.     Comments: Posterior oropharynx is symmetric appearing. Patient tolerating own secretions without difficulty. No trismus. No drooling. No hot potato voice. No swelling beneath the tongue, submandibular compartment is soft.  Eyes:     General:        Right eye: No discharge.        Left eye: No discharge.     Conjunctiva/sclera: Conjunctivae normal.     Pupils: Pupils are equal, round, and reactive to light.  Neck:     Musculoskeletal: Normal range of motion and neck supple. No edema or neck rigidity.  Cardiovascular:     Rate and Rhythm: Regular rhythm. Tachycardia present.     Heart sounds: No murmur.  Pulmonary:     Effort: Pulmonary effort is normal. No respiratory distress.     Breath sounds: Normal breath sounds. No wheezing, rhonchi or rales.  Abdominal:     General: There is no distension.     Palpations: Abdomen is soft.     Tenderness: There is no abdominal tenderness.  Lymphadenopathy:     Cervical: Cervical adenopathy (mild) present.  Skin:    General: Skin is warm and dry.     Findings: No rash.  Neurological:      Mental Status: She is alert.  Psychiatric:        Behavior: Behavior normal.      ED Treatments / Results  Labs (all labs ordered are listed, but only abnormal results are displayed) Labs Reviewed  GROUP A STREP BY PCR - Abnormal; Notable for the following components:      Result Value   Group A Strep by PCR DETECTED (*)  All other components within normal limits    EKG None  Radiology No results found.  Procedures Procedures (including critical care time)  Medications Ordered in ED Medications  penicillin g benzathine (BICILLIN LA) 1200000 UNIT/2ML injection 1.2 Million Units (has no administration in time range)  dexamethasone (DECADRON) injection 10 mg (has no administration in time range)  acetaminophen (TYLENOL) tablet 650 mg (650 mg Oral Given 08/13/18 1032)     Initial Impression / Assessment and Plan / ED Course  I have reviewed the triage vital signs and the nursing notes.  Pertinent labs & imaging results that were available during my care of the patient were reviewed by me and considered in my medical decision making (see chart for details).    Presents with complaint of sore throat.  Patient is nontoxic-appearing, vitals w/ mild tachycardia & elevated BP- doubt HTN emergency.  On exam patient with posterior oropharynx erythema/exudates and anterior cervical lymphadenopathy.  Strep test is positive.   Exam non concerning for PTA or RPA. Lungs CTA doubt pneumonia. TMs are clear doubt AOM. No meningismus. Treated in the emergency department with IM Decadron and IM Penicillin. Recommended use of Tylenol and Ibuprofen for any continued discomfort or fevers. I discussed results, treatment plan, need for PCP follow-up, and return precautions with the patient. Provided opportunity for questions, patient confirmed understanding and is in agreement with plan.   Final Clinical Impressions(s) / ED Diagnoses   Final diagnoses:  Strep throat    ED Discharge Orders     None       Cherly Anderson, PA-C 08/13/18 1121    Gerhard Munch, MD 08/15/18 [phone]

## 2018-08-13 NOTE — ED Notes (Signed)
Bed: WTR6 Expected date:  Expected time:  Means of arrival:  Comments: 

## 2018-08-13 NOTE — Discharge Instructions (Addendum)

## 2018-12-18 DIAGNOSIS — R5383 Other fatigue: Secondary | ICD-10-CM | POA: Diagnosis not present

## 2018-12-18 DIAGNOSIS — R635 Abnormal weight gain: Secondary | ICD-10-CM | POA: Diagnosis not present

## 2018-12-18 DIAGNOSIS — N951 Menopausal and female climacteric states: Secondary | ICD-10-CM | POA: Diagnosis not present

## 2018-12-25 ENCOUNTER — Other Ambulatory Visit: Payer: Self-pay

## 2018-12-25 DIAGNOSIS — Z20822 Contact with and (suspected) exposure to covid-19: Secondary | ICD-10-CM

## 2018-12-25 DIAGNOSIS — R6889 Other general symptoms and signs: Secondary | ICD-10-CM | POA: Diagnosis not present

## 2018-12-27 LAB — NOVEL CORONAVIRUS, NAA: SARS-CoV-2, NAA: NOT DETECTED

## 2018-12-31 DIAGNOSIS — E559 Vitamin D deficiency, unspecified: Secondary | ICD-10-CM | POA: Diagnosis not present

## 2018-12-31 DIAGNOSIS — Z1331 Encounter for screening for depression: Secondary | ICD-10-CM | POA: Diagnosis not present

## 2018-12-31 DIAGNOSIS — R790 Abnormal level of blood mineral: Secondary | ICD-10-CM | POA: Diagnosis not present

## 2018-12-31 DIAGNOSIS — R7309 Other abnormal glucose: Secondary | ICD-10-CM | POA: Diagnosis not present

## 2018-12-31 DIAGNOSIS — E8881 Metabolic syndrome: Secondary | ICD-10-CM | POA: Diagnosis not present

## 2018-12-31 DIAGNOSIS — Z1339 Encounter for screening examination for other mental health and behavioral disorders: Secondary | ICD-10-CM | POA: Diagnosis not present

## 2018-12-31 DIAGNOSIS — R5383 Other fatigue: Secondary | ICD-10-CM | POA: Diagnosis not present

## 2018-12-31 DIAGNOSIS — G479 Sleep disorder, unspecified: Secondary | ICD-10-CM | POA: Diagnosis not present

## 2018-12-31 DIAGNOSIS — Z6834 Body mass index (BMI) 34.0-34.9, adult: Secondary | ICD-10-CM | POA: Diagnosis not present

## 2018-12-31 DIAGNOSIS — R718 Other abnormality of red blood cells: Secondary | ICD-10-CM | POA: Diagnosis not present

## 2018-12-31 DIAGNOSIS — E7800 Pure hypercholesterolemia, unspecified: Secondary | ICD-10-CM | POA: Diagnosis not present

## 2019-01-07 DIAGNOSIS — M2550 Pain in unspecified joint: Secondary | ICD-10-CM | POA: Diagnosis not present

## 2019-01-07 DIAGNOSIS — Z6834 Body mass index (BMI) 34.0-34.9, adult: Secondary | ICD-10-CM | POA: Diagnosis not present

## 2019-01-15 DIAGNOSIS — Z6834 Body mass index (BMI) 34.0-34.9, adult: Secondary | ICD-10-CM | POA: Diagnosis not present

## 2019-01-15 DIAGNOSIS — E8881 Metabolic syndrome: Secondary | ICD-10-CM | POA: Diagnosis not present

## 2019-01-22 DIAGNOSIS — R790 Abnormal level of blood mineral: Secondary | ICD-10-CM | POA: Diagnosis not present

## 2019-01-22 DIAGNOSIS — Z6833 Body mass index (BMI) 33.0-33.9, adult: Secondary | ICD-10-CM | POA: Diagnosis not present

## 2019-02-06 DIAGNOSIS — E559 Vitamin D deficiency, unspecified: Secondary | ICD-10-CM | POA: Diagnosis not present

## 2019-02-06 DIAGNOSIS — Z6832 Body mass index (BMI) 32.0-32.9, adult: Secondary | ICD-10-CM | POA: Diagnosis not present

## 2019-02-06 DIAGNOSIS — E8881 Metabolic syndrome: Secondary | ICD-10-CM | POA: Diagnosis not present

## 2019-03-22 IMAGING — CT CT NECK W/ CM
4 of 6 series · 14 of 33 positions shown, 16 images · IV contrast (APPLIED)
Comparison: None.

CLINICAL DATA: Sore throat with difficulty swallowing since
yesterday. Unable to swallow sputum. Epiglottitis or tonsillitis
suspected

EXAM:
CT NECK WITH CONTRAST
TECHNIQUE: Multidetector CT imaging of the neck was performed using the
standard protocol following the bolus administration of intravenous
contrast.
CONTRAST:  75 cc Isovue 300 intravenous

[Series 3: axial neck · axial · 0.43mm/px · z∈[+1130,+1254]mm · 3 of 125 slices shown, 4 images]
[im 32/125  soft-tissue]
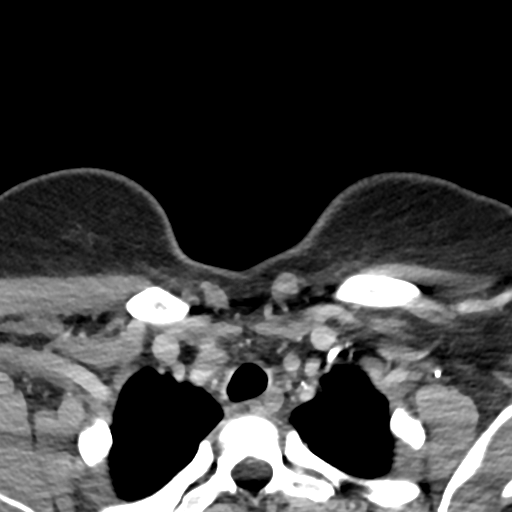
[im 32/125  bone]
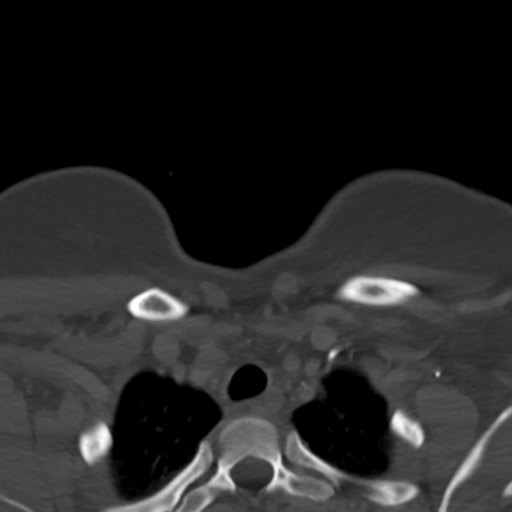
[im 63/125  bone]
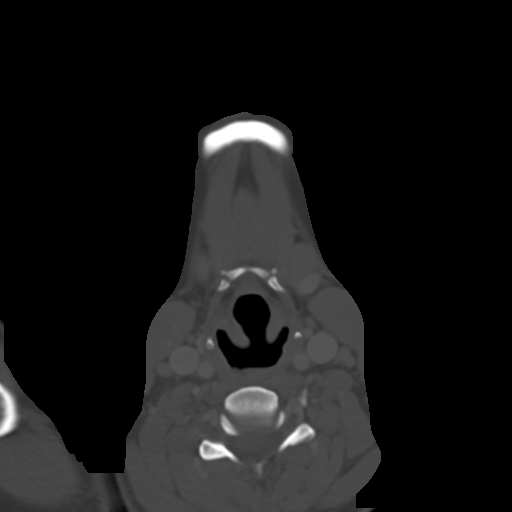
[im 94/125  bone]
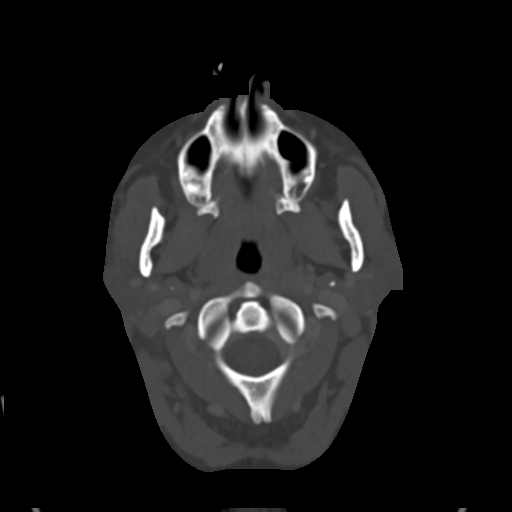

[Series 6: sag neck · sagittal · 0.49mm/px · 5 of 101 slices shown, 6 images]
[im 34/101  bone]
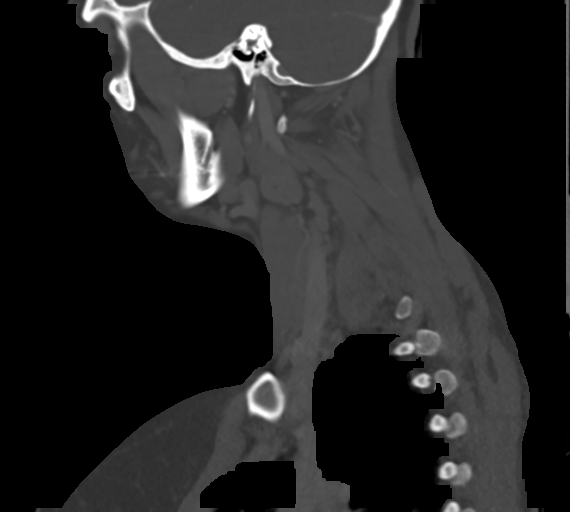
[im 42/101  bone]
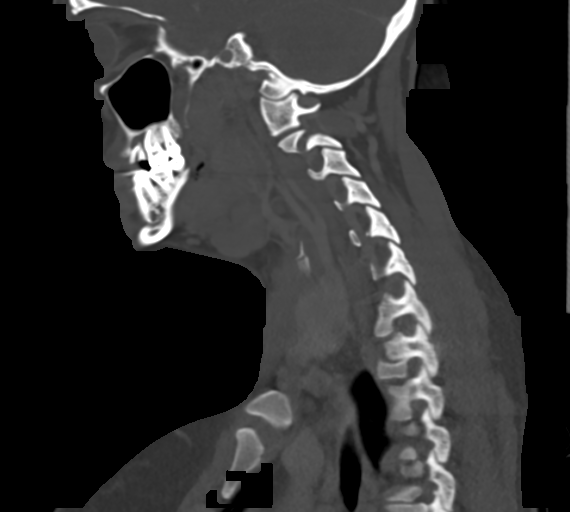
[im 51/101  soft-tissue]
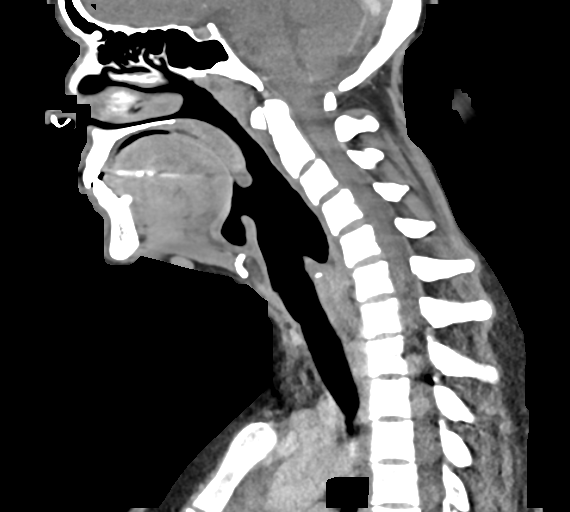
[im 51/101  bone]
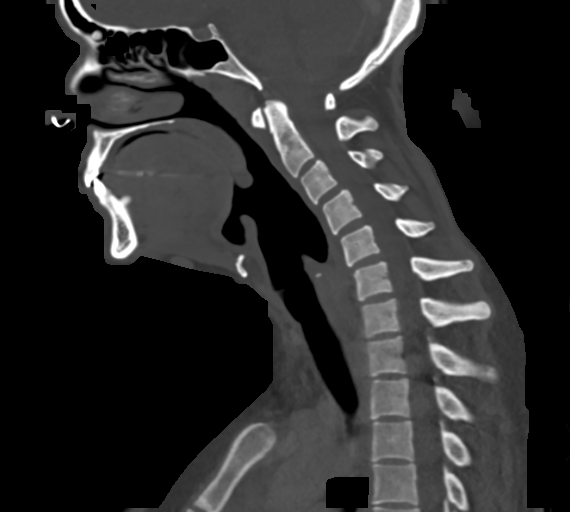
[im 59/101  bone]
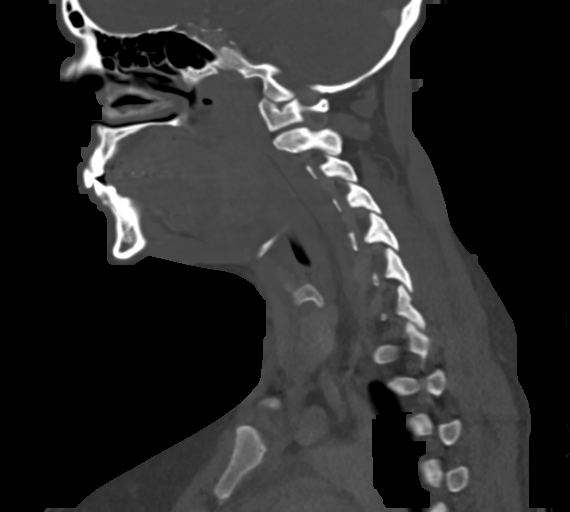
[im 67/101  bone]
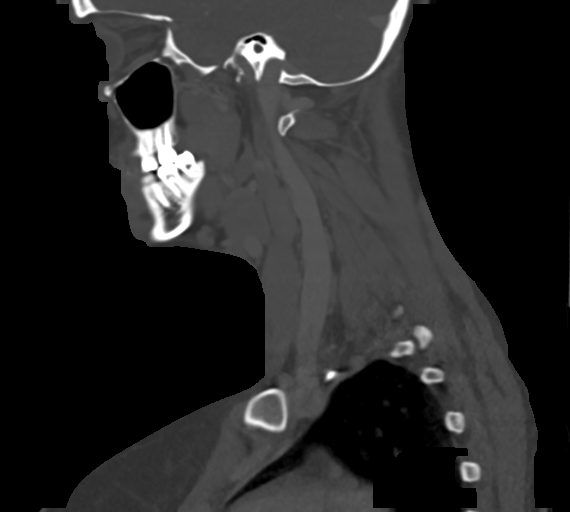

[Series 7: cor neck · coronal · 0.49mm/px · 3 of 101 slices shown]
[im 21/101  bone]
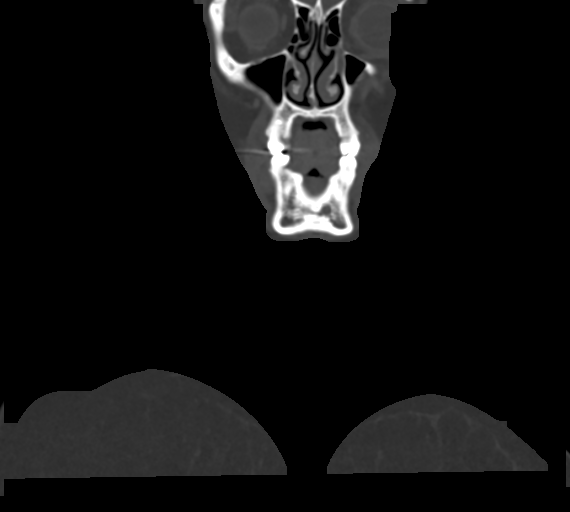
[im 41/101  bone]
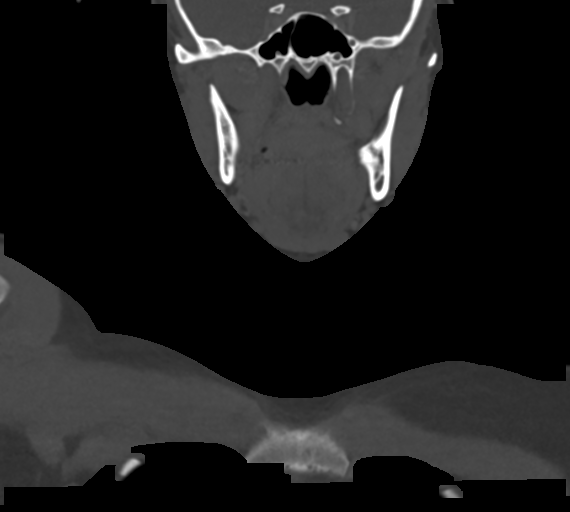
[im 61/101  bone]
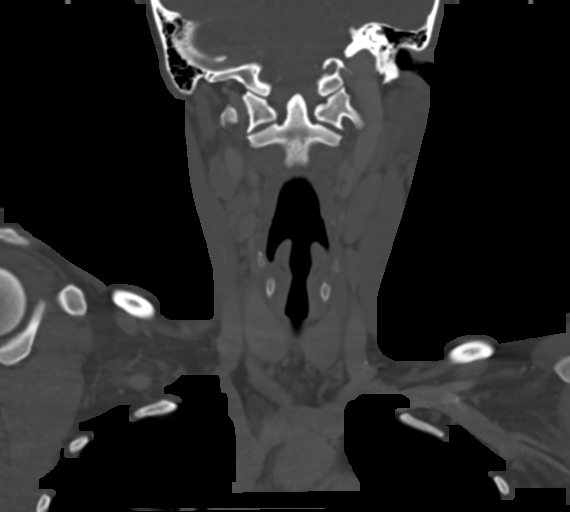

[Series 8: ax oropharynx · axial · 0.39mm/px · z∈[+1129,+1253]mm · 3 of 124 slices shown]
[im 31/124  bone]
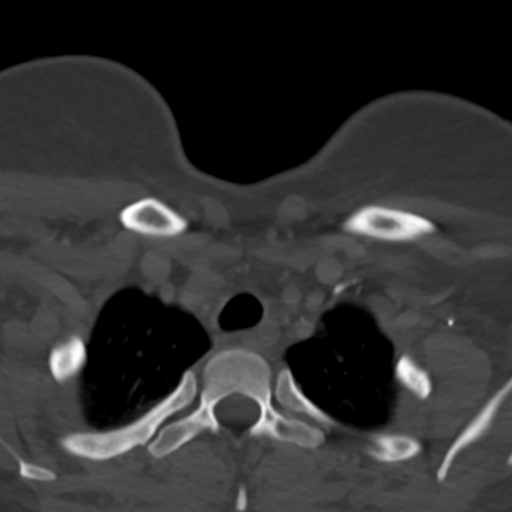
[im 62/124  bone]
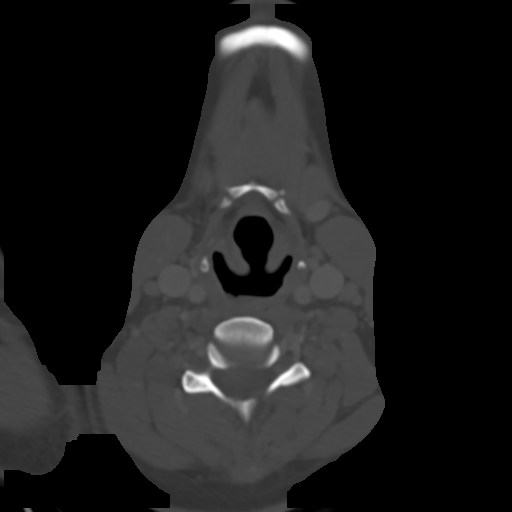
[im 93/124  bone]
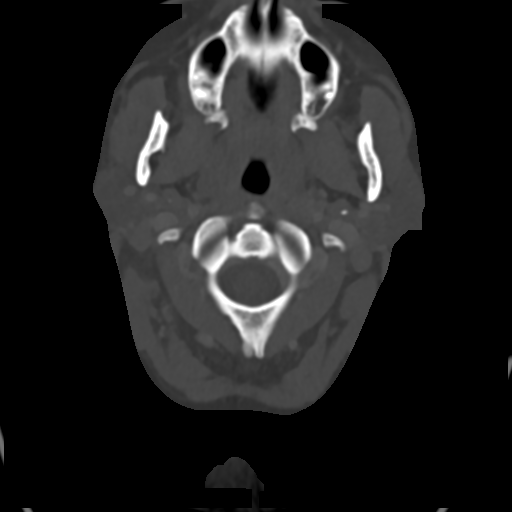

[14 of 33 positions shown; findings below may reference images not displayed]

FINDINGS: Pharynx and larynx: Palatine tonsils are thickened and both contact
the uvula. Milder adenoid thickening. No supraglottic laryngeal
submucosal edema or thickening. Negative for abscess or prevertebral
fluid. Hypopharynx is distended; no airway stenosis noted. Small
volume secretions layering in the hypopharynx.

Salivary glands: Negative

Thyroid: Asymmetrically larger right lobe without underlying nodule,
likely developmental variation.

Lymph nodes: Enlarged bilateral cervical lymph nodes, mainly in the
jugular chains but also seen in the lateral retropharyngeal station.
No suppurative changes.

Vascular: Negative.  Major venous occlusion.

Limited intracranial: Negative.

Visualized orbits: Negative

Mastoids and visualized paranasal sinuses: Clear

Skeleton:  Tooth 3 is markedly carious with periapical erosion.

Upper chest: Negative
IMPRESSION: Tonsillitis and cervical adenitis.  Negative for abscess.

## 2019-03-22 IMAGING — CT CT NECK W/ CM
4 of 6 series · 14 of 33 positions shown, 16 images · IV contrast (APPLIED)
Comparison: None.

CLINICAL DATA: Sore throat with difficulty swallowing since
yesterday. Unable to swallow sputum. Epiglottitis or tonsillitis
suspected

EXAM:
CT NECK WITH CONTRAST
TECHNIQUE: Multidetector CT imaging of the neck was performed using the
standard protocol following the bolus administration of intravenous
contrast.
CONTRAST:  75 cc Isovue 300 intravenous

[Series 3: axial neck · axial · 0.43mm/px · z∈[+1130,+1254]mm · 3 of 125 slices shown, 4 images]
[im 32/125  soft-tissue]
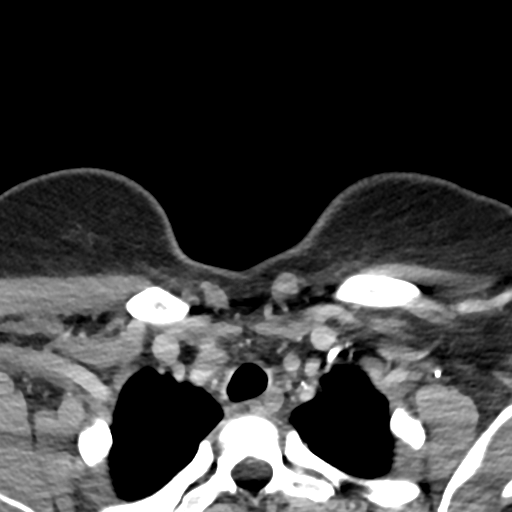
[im 32/125  bone]
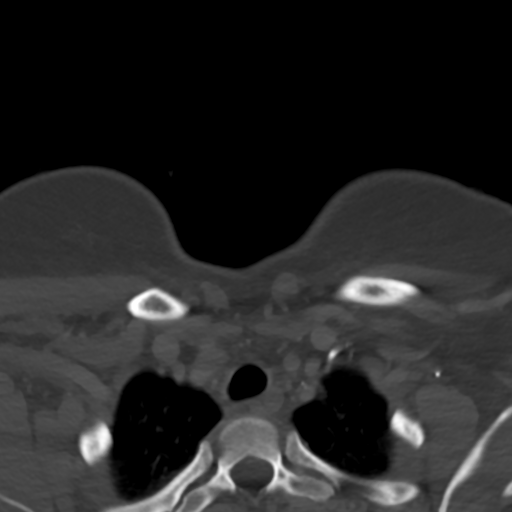
[im 63/125  bone]
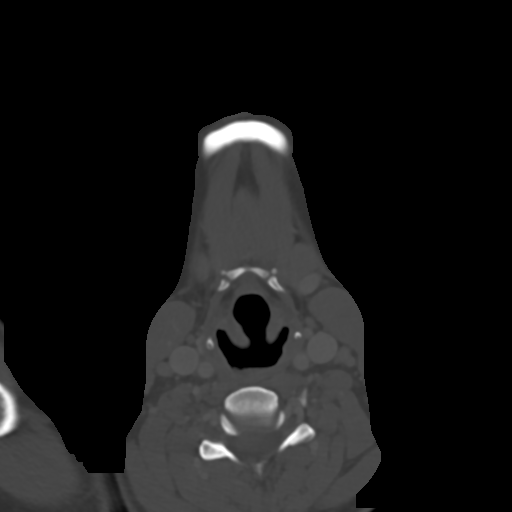
[im 94/125  bone]
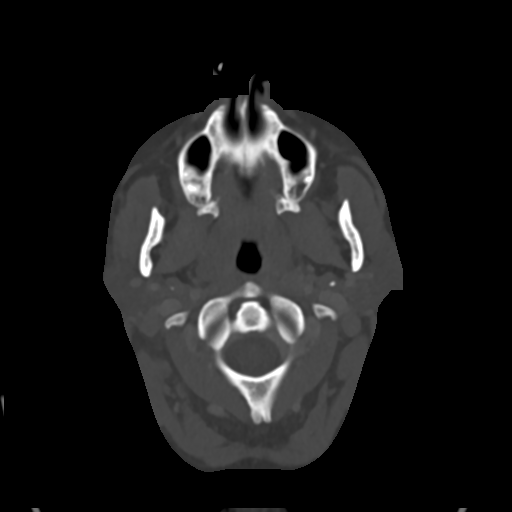

[Series 6: sag neck · sagittal · 0.49mm/px · 5 of 101 slices shown, 6 images]
[im 34/101  bone]
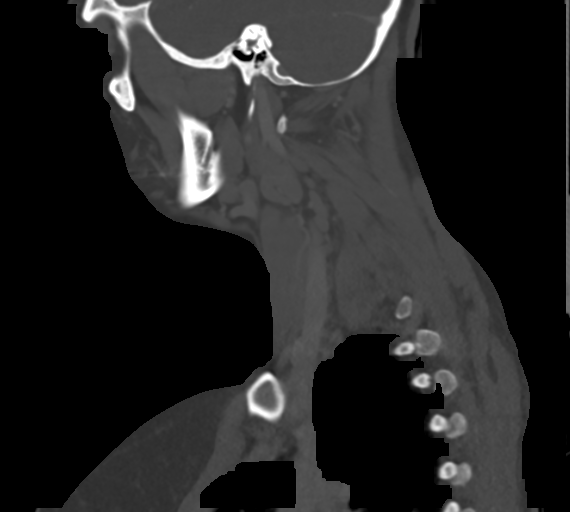
[im 42/101  bone]
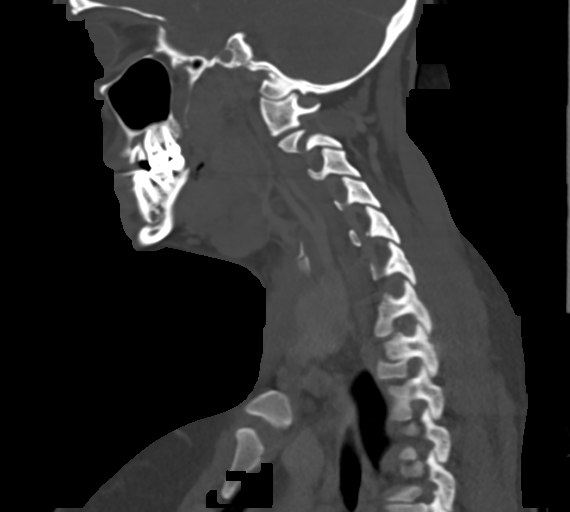
[im 51/101  soft-tissue]
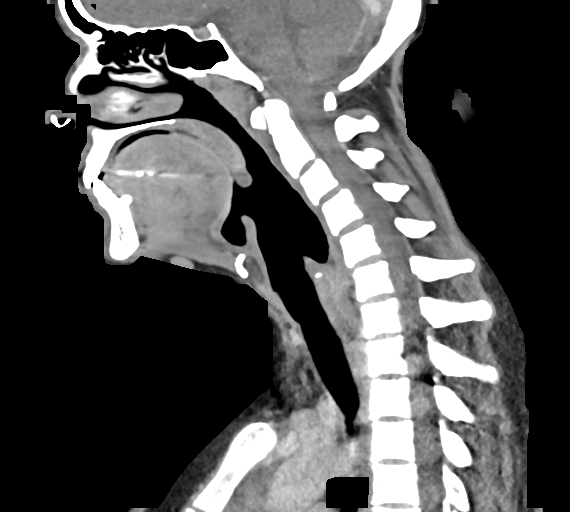
[im 51/101  bone]
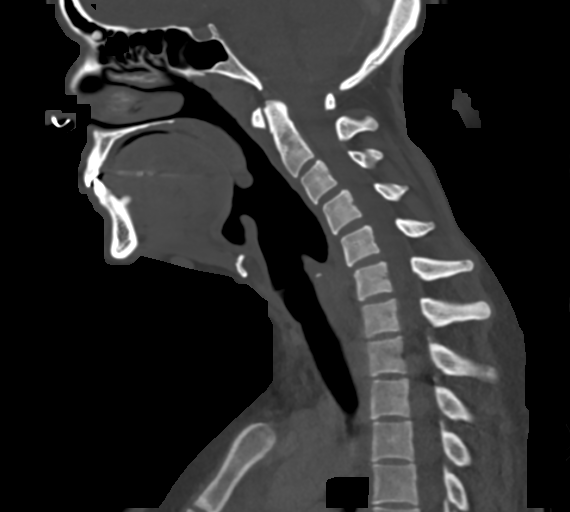
[im 59/101  bone]
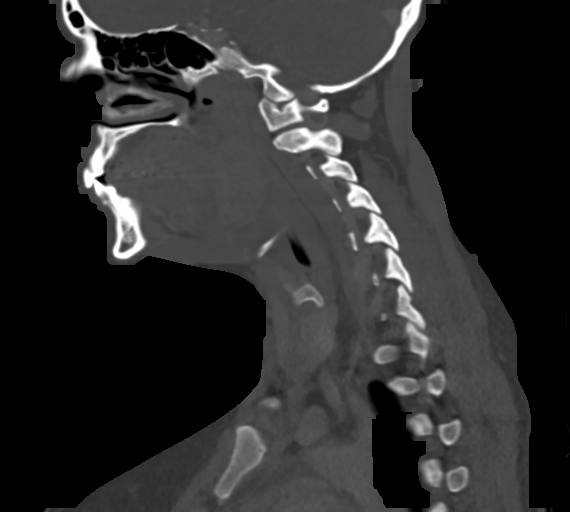
[im 67/101  bone]
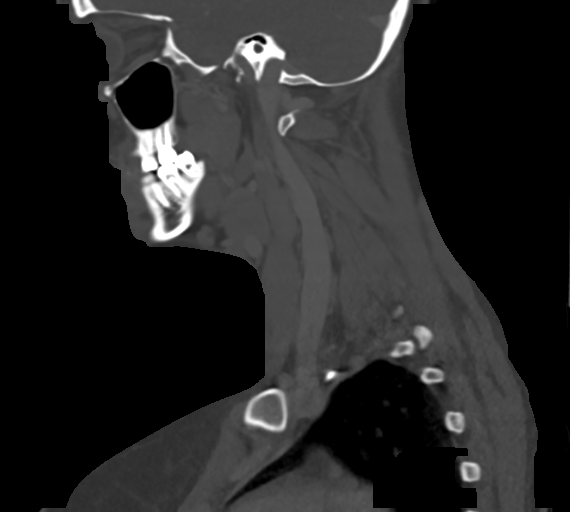

[Series 7: cor neck · coronal · 0.49mm/px · 3 of 101 slices shown]
[im 21/101  bone]
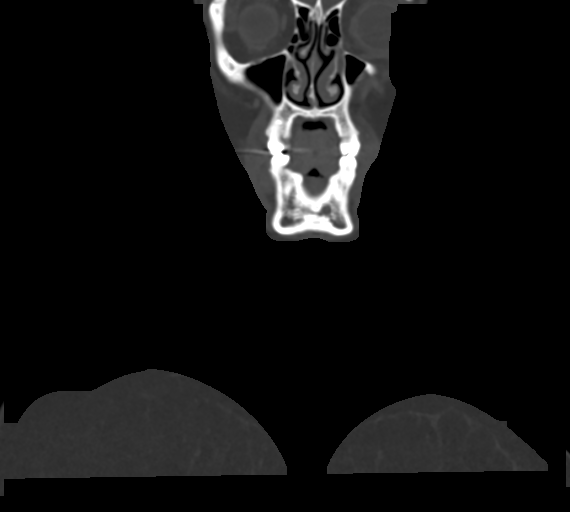
[im 41/101  bone]
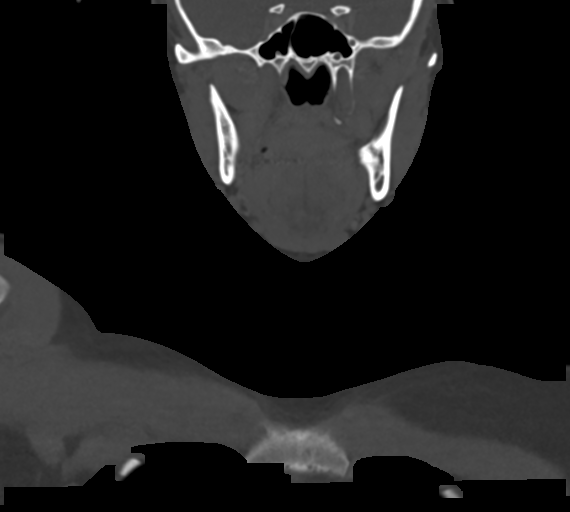
[im 61/101  bone]
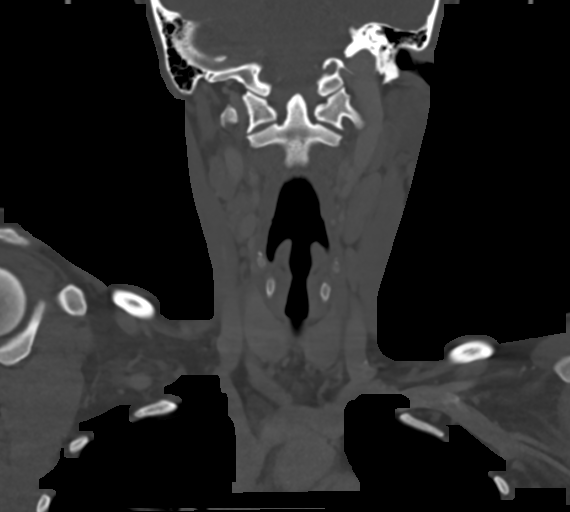

[Series 8: ax oropharynx · axial · 0.39mm/px · z∈[+1129,+1253]mm · 3 of 124 slices shown]
[im 31/124  bone]
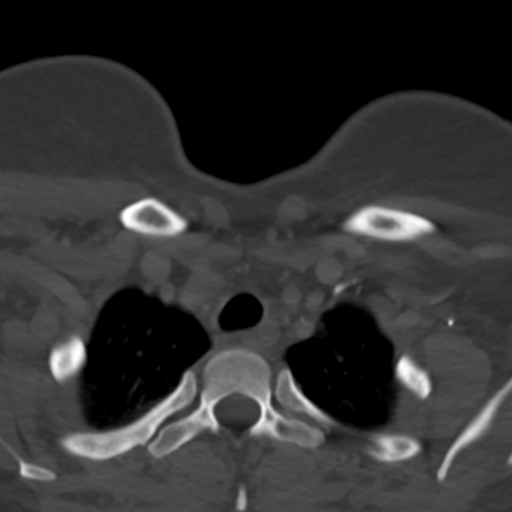
[im 62/124  bone]
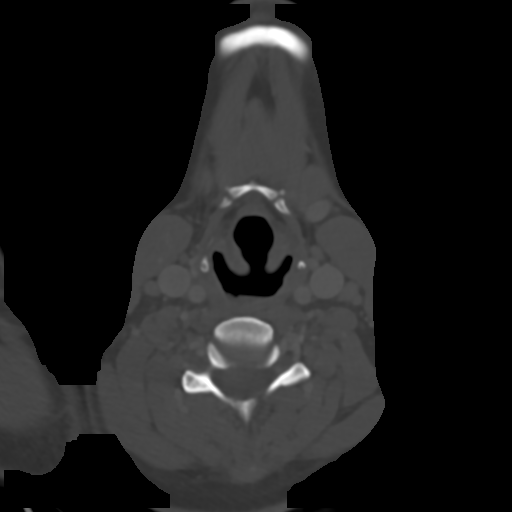
[im 93/124  bone]
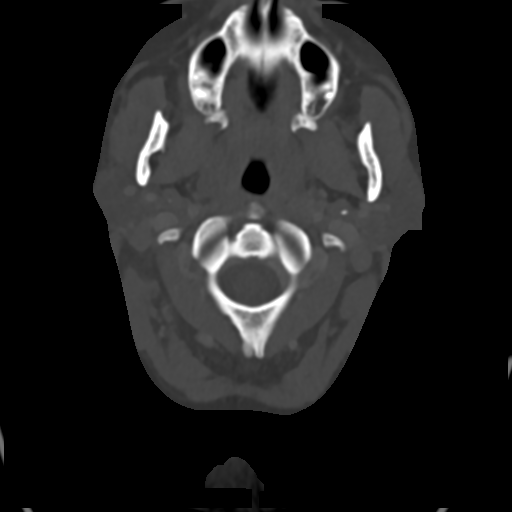

[14 of 33 positions shown; findings below may reference images not displayed]

FINDINGS: Pharynx and larynx: Palatine tonsils are thickened and both contact
the uvula. Milder adenoid thickening. No supraglottic laryngeal
submucosal edema or thickening. Negative for abscess or prevertebral
fluid. Hypopharynx is distended; no airway stenosis noted. Small
volume secretions layering in the hypopharynx.

Salivary glands: Negative

Thyroid: Asymmetrically larger right lobe without underlying nodule,
likely developmental variation.

Lymph nodes: Enlarged bilateral cervical lymph nodes, mainly in the
jugular chains but also seen in the lateral retropharyngeal station.
No suppurative changes.

Vascular: Negative.  Major venous occlusion.

Limited intracranial: Negative.

Visualized orbits: Negative

Mastoids and visualized paranasal sinuses: Clear

Skeleton:  Tooth 3 is markedly carious with periapical erosion.

Upper chest: Negative
IMPRESSION: Tonsillitis and cervical adenitis.  Negative for abscess.

## 2019-06-17 DIAGNOSIS — G5603 Carpal tunnel syndrome, bilateral upper limbs: Secondary | ICD-10-CM | POA: Diagnosis not present

## 2019-06-25 ENCOUNTER — Other Ambulatory Visit: Payer: Self-pay

## 2019-06-25 DIAGNOSIS — G5603 Carpal tunnel syndrome, bilateral upper limbs: Secondary | ICD-10-CM

## 2019-07-10 ENCOUNTER — Ambulatory Visit (INDEPENDENT_AMBULATORY_CARE_PROVIDER_SITE_OTHER): Payer: Medicaid Other | Admitting: Neurology

## 2019-07-10 ENCOUNTER — Other Ambulatory Visit: Payer: Self-pay

## 2019-07-10 DIAGNOSIS — G5603 Carpal tunnel syndrome, bilateral upper limbs: Secondary | ICD-10-CM | POA: Diagnosis not present

## 2019-07-10 NOTE — Procedures (Signed)
[facility]  [place], [place]  [place], [place] [address] Tel: [phone] Fax:  [phone] Test Date:  07/10/2019  Patient: Teresa Farley DOB: 1985-06-14 Physician: Narda Amber, DO  Sex: Female Height: 5\' 7"  Ref Phys: Berle Mull, MD  ID#: [id number] Temp: 35.0C Technician:    Patient Complaints: This is a 34 year old female referred for evaluation of bilateral wrist pain and hand paresthesias.  NCV & EMG Findings: Extensive electrodiagnostic testing of the right upper extremity and additional studies of the left shows:  1. Bilateral median sensory responses show prolonged latency (R3.5, L3.5 ms).  Bilateral ulnar sensory responses are within normal limits.   2. Bilateral median and ulnar motor responses are within normal limits.   3. There is no evidence of active or chronic motor axonal loss changes affecting any of the tested muscles.  Motor unit configuration and recruitment pattern is within normal limits.    Impression: Bilateral median neuropathy at or distal to the wrist (mild), consistent with a clinical diagnosis of carpal tunnel syndrome.    ___________________________ Narda Amber, DO    Nerve Conduction Studies Anti Sensory Summary Table   Site NR Peak (ms) Norm Peak (ms) P-T Amp (V) Norm P-T Amp  Left Median Anti Sensory (2nd Digit)  35C  Wrist    3.5 <3.4 23.3 >20  Right Median Anti Sensory (2nd Digit)  35C  Wrist    3.5 <3.4 24.0 >20  Left Ulnar Anti Sensory (5th Digit)  35C  Wrist    2.4 <3.1 20.2 >12  Right Ulnar Anti Sensory (5th Digit)  35C  Wrist    2.6 <3.1 18.3 >12   Motor Summary Table   Site NR Onset (ms) Norm Onset (ms) O-P Amp (mV) Norm O-P Amp Site1 Site2 Delta-0 (ms) Dist (cm) Vel (m/s) Norm Vel (m/s)  Left Median Motor (Abd Poll Brev)  35C  Wrist    3.6 <3.9 10.1 >6 Elbow Wrist 4.8 29.0 60 >50  Elbow    8.4  9.8         Right Median Motor (Abd Poll Brev)  35C  Wrist    3.7 <3.9 9.1 >6 Elbow Wrist 4.8  29.0 60 >50  Elbow    8.5  9.0         Left Ulnar Motor (Abd Dig Minimi)  35C  Wrist    2.0 <3.1 10.1 >7 B Elbow Wrist 3.4 22.0 65 >50  B Elbow    5.4  9.6  A Elbow B Elbow 1.6 10.0 63 >50  A Elbow    7.0  9.2         Right Ulnar Motor (Abd Dig Minimi)  35C  Wrist    2.0 <3.1 10.6 >7 B Elbow Wrist 3.4 22.0 65 >50  B Elbow    5.4  10.5  A Elbow B Elbow 1.5 10.0 67 >50  A Elbow    6.9  10.3          EMG   Side Muscle Ins Act Fibs Psw Fasc Number Recrt Dur Dur. Amp Amp. Poly Poly. Comment  Right 1stDorInt Nml Nml Nml Nml Nml Nml Nml Nml Nml Nml Nml Nml N/A  Right Abd Poll Brev Nml Nml Nml Nml Nml Nml Nml Nml Nml Nml Nml Nml N/A  Right PronatorTeres Nml Nml Nml Nml Nml Nml Nml Nml Nml Nml Nml Nml N/A  Right Biceps Nml Nml Nml Nml Nml Nml Nml Nml Nml Nml Nml Nml N/A  Right Triceps Nml Nml Nml Nml Nml Nml Nml Nml Nml Nml Nml Nml N/A  Right Deltoid Nml Nml Nml Nml Nml Nml Nml Nml Nml Nml Nml Nml N/A  Left 1stDorInt Nml Nml Nml Nml Nml Nml Nml Nml Nml Nml Nml Nml N/A  Left Abd Poll Brev Nml Nml Nml Nml Nml Nml Nml Nml Nml Nml Nml Nml N/A  Left PronatorTeres Nml Nml Nml Nml Nml Nml Nml Nml Nml Nml Nml Nml N/A  Left Biceps Nml Nml Nml Nml Nml Nml Nml Nml Nml Nml Nml Nml N/A  Left Triceps Nml Nml Nml Nml Nml Nml Nml Nml Nml Nml Nml Nml N/A  Left Deltoid Nml Nml Nml Nml Nml Nml Nml Nml Nml Nml Nml Nml N/A      Waveforms:

## 2019-08-12 DIAGNOSIS — M2550 Pain in unspecified joint: Secondary | ICD-10-CM | POA: Diagnosis not present

## 2019-08-12 DIAGNOSIS — Z6835 Body mass index (BMI) 35.0-35.9, adult: Secondary | ICD-10-CM | POA: Diagnosis not present

## 2019-09-02 DIAGNOSIS — Z6834 Body mass index (BMI) 34.0-34.9, adult: Secondary | ICD-10-CM | POA: Diagnosis not present

## 2019-09-02 DIAGNOSIS — E559 Vitamin D deficiency, unspecified: Secondary | ICD-10-CM | POA: Diagnosis not present

## 2020-07-17 ENCOUNTER — Encounter: Payer: Self-pay | Admitting: Emergency Medicine

## 2020-07-17 ENCOUNTER — Ambulatory Visit
Admission: EM | Admit: 2020-07-17 | Discharge: 2020-07-17 | Disposition: A | Discharge status: Home / Self Care | Payer: Medicaid Other | Attending: Family Medicine | Admitting: Family Medicine

## 2020-07-17 ENCOUNTER — Other Ambulatory Visit: Payer: Self-pay

## 2020-07-17 DIAGNOSIS — R112 Nausea with vomiting, unspecified: Secondary | ICD-10-CM

## 2020-07-17 DIAGNOSIS — A084 Viral intestinal infection, unspecified: Secondary | ICD-10-CM | POA: Diagnosis not present

## 2020-07-17 DIAGNOSIS — M545 Low back pain, unspecified: Secondary | ICD-10-CM

## 2020-07-17 DIAGNOSIS — E860 Dehydration: Secondary | ICD-10-CM

## 2020-07-17 DIAGNOSIS — M62838 Other muscle spasm: Secondary | ICD-10-CM

## 2020-07-17 MED ORDER — ONDANSETRON 4 MG PO TBDP
4.0000 mg | ORAL_TABLET | Freq: Once | ORAL | Status: AC
  Administered 2020-07-17: 4 mg via ORAL

## 2020-07-17 MED ORDER — CYCLOBENZAPRINE HCL 10 MG PO TABS: 10.0000 mg | ORAL_TABLET | Freq: Two times a day (BID) | ORAL | 0 refills | Status: DC | PRN

## 2020-07-17 MED ORDER — ONDANSETRON HCL 4 MG PO TABS: 4.0000 mg | ORAL_TABLET | Freq: Four times a day (QID) | ORAL | 0 refills | Status: DC

## 2020-07-17 MED ORDER — SODIUM CHLORIDE 0.9 % IV BOLUS
1000.0000 mL | Freq: Once | INTRAVENOUS | Status: AC
  Administered 2020-07-17: 1000 mL via INTRAVENOUS

## 2020-07-17 NOTE — ED Provider Notes (Signed)
[facility]   [id number] 07/17/20 Arrival Time: 0951  CC: ABDOMINAL PAIN  SUBJECTIVE:  Teresa Farley is a 35 y.o. female who presents with nausea and vomiting multiple times since last night. Reports that she has been unable to void since yesterday. Reports that her child had similar symptoms earlier this week. Denies abdominal cramping. Has not taken OTC medications for this. Symptoms are worsened with eating/drinking.    Denies fever, chills, weight changes, chest pain, SOB, diarrhea, constipation, hematochezia, melena, dysuria, difficulty urinating, increased frequency or urgency, flank pain, loss of bowel or bladder function, vaginal discharge, vaginal odor, vaginal bleeding, dyspareunia, pelvic pain.     No LMP recorded.  ROS: As per HPI.  All other pertinent ROS negative.     Past Medical History:  Diagnosis Date  . Abnormal Pap smear 2012  . Anemia   . Condyloma 02/23/2011   Patient states that she is receiving treatment from [facility]   . H/O varicella   . Irregular periods/menstrual cycles   . Low iron    Past Surgical History:  Procedure Laterality Date  . CESAREAN SECTION N/A 09/13/2015   Procedure: CESAREAN SECTION;  Surgeon: Geryl Rankins, MD;  Location: [facility];  Service: Obstetrics;  Laterality: N/A;  . DILATION AND CURETTAGE OF UTERUS  june 2012   No Known Allergies No current facility-administered medications on file prior to encounter.   Current Outpatient Medications on File Prior to Encounter  Medication Sig Dispense Refill  . HYDROcodone-acetaminophen (HYCET) 7.5-325 mg/15 ml solution Take 10 mLs by mouth every 6 (six) hours as needed for moderate pain. 120 mL 0  . ibuprofen (ADVIL,MOTRIN) 200 MG tablet Take 600 mg by mouth every 6 (six) hours as needed for headache or mild pain.    Marland Kitchen ibuprofen (ADVIL,MOTRIN) 600 MG tablet Take 1 tablet (600 mg total) by mouth every 6 (six) hours. 30 tablet 0  . Prenatal Vit-Fe Fumarate-FA  (PRENATAL MULTIVITAMIN) TABS tablet Take 1 tablet by mouth daily at 12 noon.      Social History   Socioeconomic History  . Marital status: Single    Spouse name: Not on file  . Number of children: Not on file  . Years of education: Not on file  . Highest education level: Not on file  Occupational History  . Not on file  Tobacco Use  . Smoking status: Former Smoker    Packs/day: 0.50    Years: 5.00    Pack years: 2.50    Types: Cigarettes    Quit date: 02/13/2015    Years since quitting: 5.4  . Smokeless tobacco: Never Used  Vaping Use  . Vaping Use: Never used  Substance and Sexual Activity  . Alcohol use: Yes    Alcohol/week: 0.0 standard drinks    Comment: occasionally  . Drug use: No  . Sexual activity: Yes    Birth control/protection: None    Comment: MIRENA  Other Topics Concern  . Not on file  Social History Narrative  . Not on file   Social Determinants of Health   Financial Resource Strain: Not on file  Food Insecurity: Not on file  Transportation Needs: Not on file  Physical Activity: Not on file  Stress: Not on file  Social Connections: Not on file  Intimate Partner Violence: Not on file   Family History  Problem Relation Age of Onset  . Breast cancer Maternal Grandmother   . Cancer Maternal Grandmother  OVARIAN     OBJECTIVE:  Vitals:   07/17/20 1005 07/17/20 1017  BP: 107/61   Pulse: (!) 103 85  Resp: 16   Temp: 98 F (36.7 C)   TempSrc: Oral   SpO2: 98%     General appearance: Alert; NAD HEENT: NCAT. Oropharynx clear.  Lungs: clear to auscultation bilaterally without adventitious breath sounds Heart: regular rate and rhythm.  Radial pulses 2+ symmetrical bilaterally Abdomen: soft, non-distended; normal active bowel sounds; generalized mild tenderness; nontender at McBurney's point; negative Murphy's sign; negative rebound; no guarding Back: no CVA tenderness, lower back TTP and muscles in spasm Extremities: no edema;  symmetrical with no gross deformities Skin: warm and dry Neurologic: normal gait Psychological: alert and cooperative; normal mood and affect  LABS: No results found for this or any previous visit (from the past 24 hour(s)).  DIAGNOSTIC STUDIES: No results found.   ASSESSMENT & PLAN:  1. Viral gastroenteritis   2. Dehydration   3. Nausea and vomiting, intractability of vomiting not specified, unspecified vomiting type   4. Muscle spasm   5. Acute bilateral low back pain without sciatica     Meds ordered this encounter  Medications  . ondansetron (ZOFRAN-ODT) disintegrating tablet 4 mg  . cyclobenzaprine (FLEXERIL) 10 MG tablet    Sig: Take 1 tablet (10 mg total) by mouth 2 (two) times daily as needed for muscle spasms.    Dispense:  20 tablet    Refill:  0    Order Specific Question:   Supervising Provider    Answer:   Merrilee Jansky [id number]  . sodium chloride 0.9 % bolus 1,000 mL  . ondansetron (ZOFRAN) 4 MG tablet    Sig: Take 1 tablet (4 mg total) by mouth every 6 (six) hours.    Dispense:  12 tablet    Refill:  0    Order Specific Question:   Supervising Provider    Answer:   Merrilee Jansky [id number]    Zofran in office today IV NS bolus in office today Prescribed zofran Prescribed cyclobenzaprine for muscle spasam  Get rest and drink plenty of fluids  DIET Instructions:  30 minutes after taking nausea medicine, begin with sips of clear liquids. If able to hold down 2 - 4 ounces for 30 minutes, begin drinking more. Increase your fluid intake to replace losses. Clear liquids only for 24 hours (water, tea, sport drinks, clear flat ginger ale or cola and juices, broth, jello, popsicles, ect). Advance to bland foods, applesauce, rice, baked or boiled chicken, ect. Avoid milk, greasy foods and anything that doesn't agree with you.  If you experience new or worsening symptoms return or go to ER such as fever, chills, nausea, vomiting, diarrhea, bloody or dark  tarry stools, constipation, urinary symptoms, worsening abdominal discomfort, symptoms that do not improve with medications, inability to keep fluids down.  Reviewed expectations re: course of current medical issues. Questions answered. Outlined signs and symptoms indicating need for more acute intervention. Patient verbalized understanding. After Visit Summary given.   Moshe Cipro, NP 07/17/20 1125

## 2020-07-17 NOTE — Discharge Instructions (Addendum)
You have received Zofran in the office today for nausea  You have received IV fluids in the office as well  I have sent in flexeril for you to take twice a day as needed for muscle spasms. This medication can make you sleepy. Do not drive or operate heavy machinery with this medication.  I have sent in Zofran for you to take one tablet every 8 hours as needed for nausea.  If these measures are not helping, and you are still not urinating by this afternoon,  I would have you go to the ER for further evaluation and treatment

## 2020-07-17 NOTE — ED Triage Notes (Addendum)
Pt has been vomiting multiple times since last night.  Unable to keep liquids down.  One of her children came home from daycare throwing up the other day

## 2021-08-04 ENCOUNTER — Ambulatory Visit: Payer: Self-pay

## 2021-08-26 ENCOUNTER — Ambulatory Visit
Admission: RE | Admit: 2021-08-26 | Discharge: 2021-08-26 | Disposition: A | Discharge status: Home / Self Care | Payer: Medicaid Other | Source: Ambulatory Visit | Attending: Family Medicine | Admitting: Family Medicine

## 2021-08-26 VITALS — BP 132/83 | HR 82 | Temp 98.9°F | Resp 20

## 2021-08-26 DIAGNOSIS — K2900 Acute gastritis without bleeding: Secondary | ICD-10-CM | POA: Diagnosis not present

## 2021-08-26 DIAGNOSIS — R1013 Epigastric pain: Secondary | ICD-10-CM

## 2021-08-26 MED ORDER — SUCRALFATE 1 G PO TABS: 1.0000 g | ORAL_TABLET | Freq: Three times a day (TID) | ORAL | 0 refills | Status: DC | PRN

## 2021-08-26 MED ORDER — LIDOCAINE VISCOUS HCL 2 % MT SOLN
15.0000 mL | Freq: Once | OROMUCOSAL | Status: AC
  Administered 2021-08-26: 15 mL via ORAL

## 2021-08-26 MED ORDER — PANTOPRAZOLE SODIUM 40 MG PO TBEC: 40.0000 mg | DELAYED_RELEASE_TABLET | Freq: Every day | ORAL | 0 refills | Status: DC

## 2021-08-26 MED ORDER — ALUM & MAG HYDROXIDE-SIMETH 200-200-20 MG/5ML PO SUSP
30.0000 mL | Freq: Once | ORAL | Status: AC
  Administered 2021-08-26: 30 mL via ORAL

## 2021-08-26 NOTE — ED Triage Notes (Signed)
Pt states she is hurting right under her breast and she thought it was heartburn on Tuesday ? ?Pt states she is having a burning sensation ? ? ?

## 2021-08-26 NOTE — ED Provider Notes (Signed)
?[facility] ? ? ? ?CSN: [id number] ?Arrival date & time: 08/26/21  1445 ? ? ?  ? ?History   ?Chief Complaint ?Chief Complaint  ?Patient presents with  ? Abdominal Pain  ?  Entered by patient  ? ? ?HPI ?Teresa Farley is a 36 y.o. female.  ? ?Presenting today with 4-day history of epigastric abdominal pain that feels like a squeezing, gnawing sensation.  She states it is worse when she tries to eat something and she instantly feels nauseous with increasing pain.  She denies vomiting, diarrhea, constipation, fever, chills, new medications or diet changes.  Does have a history of acid reflux and initially thought this is what was happening so started taking Pepcid with minimal relief. ? ? ?Past Medical History:  ?Diagnosis Date  ? Abnormal Pap smear 2012  ? Anemia   ? 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 Patient states that she is receiving treatment from [facility]   ? H/O varicella   ? Irregular periods/menstrual cycles   ? Low iron   ? ? ?Patient Active Problem List  ? Diagnosis Date Noted  ? Delivery of twins, both live--Twin A vaginal delivery, Twin B cesarean 09/14/2015  ? Twin gestation, dichorionic diamniotic 09/13/2015  ? Twins 03/20/2015  ? Vaginal bleeding in pregnancy 03/20/2015  ? 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 ? ?Past Surgical History:  ?Procedure Laterality Date  ? CESAREAN SECTION N/A 09/13/2015  ? Procedure: CESAREAN SECTION;  Surgeon: Thurnell Lose, MD;  Location: [facility];  Service: Obstetrics;  Laterality: N/A;  ? DILATION AND CURETTAGE OF UTERUS  june 2012  ? ? ?OB History   ? ? Gravida  ?4  ? Para  ?2  ? Term  ?2  ? Preterm  ?0  ? AB  ?2  ? Living  ?3  ?  ? ? SAB  ?1  ? IAB  ?1  ? Ectopic  ?0  ? Multiple  ?1  ? Live Births  ?3  ?   ?  ?  ? ? ? ?Home Medications   ? ?Prior to Admission medications   ?Medication Sig Start Date End Date Taking? Authorizing Provider  ?pantoprazole (PROTONIX) 40 MG tablet Take 1 tablet (40 mg total) by mouth daily. 08/26/21  Yes Volney American, PA-C   ?sucralfate (CARAFATE) 1 g tablet Take 1 tablet (1 g total) by mouth 3 (three) times daily as needed. May dissolve 1 tablet into a glass of water and drink as needed 08/26/21  Yes Volney American, PA-C  ?cyclobenzaprine (FLEXERIL) 10 MG tablet Take 1 tablet (10 mg total) by mouth 2 (two) times daily as needed for muscle spasms. 07/17/20   Faustino Congress, NP  ?HYDROcodone-acetaminophen (HYCET) 7.5-325 mg/15 ml solution Take 10 mLs by mouth every 6 (six) hours as needed for moderate pain. 03/09/17   Virgel Manifold, MD  ?ibuprofen (ADVIL,MOTRIN) 200 MG tablet Take 600 mg by mouth every 6 (six) hours as needed for headache or mild pain.    [provider]  ?ibuprofen (ADVIL,MOTRIN) 600 MG tablet Take 1 tablet (600 mg total) by mouth every 6 (six) hours. 09/15/15   Standard, Venus, CNM  ?ondansetron (ZOFRAN) 4 MG tablet Take 1 tablet (4 mg total) by mouth every 6 (six) hours. 07/17/20   Faustino Congress, NP  ?Prenatal Vit-Fe Fumarate-FA (PRENATAL MULTIVITAMIN) TABS tablet Take 1 tablet by mouth daily at 12 noon.     [provider]  ? ? ?Family History ?Family History  ?Problem Relation Age  of Onset  ? Breast cancer Maternal Grandmother   ? Cancer Maternal Grandmother   ?     OVARIAN  ? ? ?Social History ?Social History  ? ?Tobacco Use  ? Smoking status: Every Day  ?  Packs/day: 0.50  ?  Years: 5.00  ?  Pack years: 2.50  ?  Types: Cigarettes  ?  Last attempt to quit: 02/13/2015  ?  Years since quitting: 6.5  ? Smokeless tobacco: Never  ?Vaping Use  ? Vaping Use: Never used  ?Substance Use Topics  ? Alcohol use: Yes  ?  Alcohol/week: 0.0 standard drinks  ?  Comment: occasionally  ? Drug use: No  ? ? ? ?Allergies   ?Patient has no known allergies. ? ? ?Review of Systems ?Review of Systems ?Per HPI ? ?Physical Exam ?Triage Vital Signs ?ED Triage Vitals  ?Enc Vitals Group  ?   BP 08/26/21 1456 132/83  ?   Pulse Rate 08/26/21 1456 82  ?   Resp 08/26/21 1456 20  ?   Temp 08/26/21 1456 98.9 ?F (37.2  ?C)  ?   Temp Source 08/26/21 1456 Oral  ?   SpO2 08/26/21 1456 94 %  ?   Weight --   ?   Height --   ?   Head Circumference --   ?   Peak Flow --   ?   Pain Score 08/26/21 1453 8  ?   Pain Loc --   ?   Pain Edu? --   ?   Excl. in [place]? --   ? ?No data found. ? ?Updated Vital Signs ?BP 132/83 (BP Location: Right Arm)   Pulse 82   Temp 98.9 ?F (37.2 ?C) (Oral)   Resp 20   LMP 08/09/2021 (Exact Date)   SpO2 94%  ? ?Visual Acuity ?Right Eye Distance:   ?Left Eye Distance:   ?Bilateral Distance:   ? ?Right Eye Near:   ?Left Eye Near:    ?Bilateral Near:    ? ?Physical Exam ?Vitals and nursing note reviewed.  ?Constitutional:   ?   Appearance: Normal appearance. She is not ill-appearing.  ?HENT:  ?   Head: Atraumatic.  ?   Mouth/Throat:  ?   Mouth: Mucous membranes are moist.  ?   Pharynx: Oropharynx is clear. No posterior oropharyngeal erythema.  ?Eyes:  ?   Extraocular Movements: Extraocular movements intact.  ?   Conjunctiva/sclera: Conjunctivae normal.  ?Cardiovascular:  ?   Rate and Rhythm: Normal rate and regular rhythm.  ?   Heart sounds: Normal heart sounds.  ?Pulmonary:  ?   Effort: Pulmonary effort is normal.  ?   Breath sounds: Normal breath sounds.  ?Abdominal:  ?   General: Bowel sounds are normal. There is no distension.  ?   Palpations: Abdomen is soft.  ?   Tenderness: There is abdominal tenderness. There is no right CVA tenderness, left CVA tenderness or guarding.  ?   Comments: Epigastric tenderness to palpation without distention or guarding  ?Musculoskeletal:     ?   General: Normal range of motion.  ?   Cervical back: Normal range of motion and neck supple.  ?Skin: ?   General: Skin is warm and dry.  ?Neurological:  ?   Mental Status: She is alert and oriented to person, place, and time.  ?Psychiatric:     ?   Mood and Affect: Mood normal.     ?   Thought Content: Thought content normal.     ?  Judgment: Judgment normal.  ? ? ? ?UC Treatments / Results  ?Labs ?(all labs ordered are listed, but  only abnormal results are displayed) ?Labs Reviewed - No data to display ? ?EKG ? ? ?Radiology ?No results found. ? ?Procedures ?Procedures (including critical care time) ? ?Medications Ordered in UC ?Medications  ?alum & mag hydroxide-simeth (MAALOX/MYLANTA) 200-200-20 MG/5ML suspension 30 mL (30 mLs Oral Given 08/26/21 1533)  ?  And  ?lidocaine (XYLOCAINE) 2 % viscous mouth solution 15 mL (15 mLs Oral Given 08/26/21 1533)  ? ? ?Initial Impression / Assessment and Plan / UC Course  ?I have reviewed the triage vital signs and the nursing notes. ? ?Pertinent labs & imaging results that were available during my care of the patient were reviewed by me and considered in my medical decision making (see chart for details). ? ?  ? ?Vitals and exam overall reassuring today, suspicious for gastritis.  Will treat with GI cocktail in clinic, Protonix, Carafate, brat diet, fluids.  Strict return precautions given for any worsening symptoms. ? ?Final Clinical Impressions(s) / UC Diagnoses  ? ?Final diagnoses:  ?Epigastric pain  ?Acute gastritis, presence of bleeding unspecified, unspecified gastritis type  ? ?Discharge Instructions   ?None ?  ? ?ED Prescriptions   ? ? Medication Sig Dispense Auth. Provider  ? pantoprazole (PROTONIX) 40 MG tablet Take 1 tablet (40 mg total) by mouth daily. 30 tablet Volney American, [place]  ? sucralfate (CARAFATE) 1 g tablet Take 1 tablet (1 g total) by mouth 3 (three) times daily as needed. May dissolve 1 tablet into a glass of water and drink as needed 60 tablet Volney American, [place]  ? ?  ? ?PDMP not reviewed this encounter. ?  ?Volney American, PA-C ?08/26/21 1535 ? ?

## 2021-09-22 ENCOUNTER — Ambulatory Visit: Payer: Self-pay

## 2021-09-22 ENCOUNTER — Ambulatory Visit
Admission: EM | Admit: 2021-09-22 | Discharge: 2021-09-22 | Disposition: A | Discharge status: Home / Self Care | Payer: Medicaid Other | Attending: Family Medicine | Admitting: Family Medicine

## 2021-09-22 ENCOUNTER — Encounter: Payer: Self-pay | Admitting: Emergency Medicine

## 2021-09-22 DIAGNOSIS — N921 Excessive and frequent menstruation with irregular cycle: Secondary | ICD-10-CM

## 2021-09-22 LAB — POCT URINALYSIS DIP (MANUAL ENTRY)
Bilirubin, UA: NEGATIVE
Glucose, UA: NEGATIVE mg/dL
Ketones, POC UA: NEGATIVE mg/dL
Leukocytes, UA: NEGATIVE
Nitrite, UA: NEGATIVE
Protein Ur, POC: NEGATIVE mg/dL
Spec Grav, UA: >=1.03 — AB (ref 1.010–1.025)
Urobilinogen, UA: 0.2 E.U./dL
pH, UA: 6 (ref 5.0–8.0)

## 2021-09-22 MED ORDER — MEGESTROL ACETATE 40 MG PO TABS: 40.0000 mg | ORAL_TABLET | Freq: Every day | ORAL | 0 refills | Status: DC

## 2021-09-22 NOTE — ED Triage Notes (Signed)
Vaginal bleeding states last period of March 27th, stopped on March 30th.  Started back on April 4th and has been on since.  Has had lower ABD cramping.  States she is passing a lot of blood clots. ?

## 2021-09-22 NOTE — ED Provider Notes (Signed)
?[facility] ? ? ? ?CSN: [id number] ?Arrival date & time: 09/22/21  1449 ? ? ?  ? ?History   ?Chief Complaint ?No chief complaint on file. ? ? ?HPI ?Teresa Farley is a 36 y.o. female.  ? ?Patient presenting today with 5 or 48-month history of heavy menstrual bleeding, now with almost a month of continuous heavy bleeding, cramping, passing large clots of blood.  States that she has always had painful periods but never prolonged periods of bleeding like this.  She has a history of a tubal ligation and does not take any type of birth control at this time.  Does not currently have an OB/GYN ? ? ?Past Medical History:  ?Diagnosis Date  ? Abnormal Pap smear 2012  ? Anemia   ? 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 Patient states that she is receiving treatment from [facility]   ? H/O varicella   ? Irregular periods/menstrual cycles   ? Low iron   ? ? ?Patient Active Problem List  ? Diagnosis Date Noted  ? Delivery of twins, both live--Twin A vaginal delivery, Twin B cesarean 09/14/2015  ? Twin gestation, dichorionic diamniotic 09/13/2015  ? Twins 03/20/2015  ? Vaginal bleeding in pregnancy 03/20/2015  ? 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 ? ?Past Surgical History:  ?Procedure Laterality Date  ? CESAREAN SECTION N/A 09/13/2015  ? Procedure: CESAREAN SECTION;  Surgeon: Geryl Rankins, MD;  Location: [facility];  Service: Obstetrics;  Laterality: N/A;  ? DILATION AND CURETTAGE OF UTERUS  june 2012  ? ? ?OB History   ? ? Gravida  ?4  ? Para  ?2  ? Term  ?2  ? Preterm  ?0  ? AB  ?2  ? Living  ?3  ?  ? ? SAB  ?1  ? IAB  ?1  ? Ectopic  ?0  ? Multiple  ?1  ? Live Births  ?3  ?   ?  ?  ? ? ? ?Home Medications   ? ?Prior to Admission medications   ?Medication Sig Start Date End Date Taking? Authorizing Provider  ?megestrol (MEGACE) 40 MG tablet Take 1 tablet (40 mg total) by mouth daily. 09/22/21  Yes Particia Nearing, PA-C  ?cyclobenzaprine (FLEXERIL) 10 MG tablet Take 1 tablet (10 mg total) by mouth 2 (two) times daily as needed  for muscle spasms. 07/17/20   Moshe Cipro, NP  ?HYDROcodone-acetaminophen (HYCET) 7.5-325 mg/15 ml solution Take 10 mLs by mouth every 6 (six) hours as needed for moderate pain. 03/09/17   Raeford Razor, MD  ?ibuprofen (ADVIL,MOTRIN) 200 MG tablet Take 600 mg by mouth every 6 (six) hours as needed for headache or mild pain.    [provider]  ?ibuprofen (ADVIL,MOTRIN) 600 MG tablet Take 1 tablet (600 mg total) by mouth every 6 (six) hours. 09/15/15   Standard, Venus, CNM  ?ondansetron (ZOFRAN) 4 MG tablet Take 1 tablet (4 mg total) by mouth every 6 (six) hours. 07/17/20   Moshe Cipro, NP  ?pantoprazole (PROTONIX) 40 MG tablet Take 1 tablet (40 mg total) by mouth daily. 08/26/21   Particia Nearing, PA-C  ?Prenatal Vit-Fe Fumarate-FA (PRENATAL MULTIVITAMIN) TABS tablet Take 1 tablet by mouth daily at 12 noon.     [provider]  ?sucralfate (CARAFATE) 1 g tablet Take 1 tablet (1 g total) by mouth 3 (three) times daily as needed. May dissolve 1 tablet into a glass of water and drink as needed 08/26/21   Particia Nearing, PA-C  ? ? ?  Family History ?Family History  ?Problem Relation Age of Onset  ? Breast cancer Maternal Grandmother   ? Cancer Maternal Grandmother   ?     OVARIAN  ? ? ?Social History ?Social History  ? ?Tobacco Use  ? Smoking status: Every Day  ?  Packs/day: 0.50  ?  Years: 5.00  ?  Pack years: 2.50  ?  Types: Cigarettes  ?  Last attempt to quit: 02/13/2015  ?  Years since quitting: 6.6  ? Smokeless tobacco: Never  ?Vaping Use  ? Vaping Use: Never used  ?Substance Use Topics  ? Alcohol use: Yes  ?  Alcohol/week: 0.0 standard drinks  ?  Comment: occasionally  ? Drug use: No  ? ? ? ?Allergies   ?Patient has no known allergies. ? ? ?Review of Systems ?Review of Systems ?Per HPI ? ?Physical Exam ?Triage Vital Signs ?ED Triage Vitals  ?Enc Vitals Group  ?   BP 09/22/21 1454 (!) 148/82  ?   Pulse Rate 09/22/21 1454 96  ?   Resp 09/22/21 1454 18  ?   Temp 09/22/21 1454  98.1 ?F (36.7 ?C)  ?   Temp Source 09/22/21 1454 Oral  ?   SpO2 09/22/21 1454 100 %  ?   Weight --   ?   Height --   ?   Head Circumference --   ?   Peak Flow --   ?   Pain Score 09/22/21 1455 7  ?   Pain Loc --   ?   Pain Edu? --   ?   Excl. in [place]? --   ? ?No data found. ? ?Updated Vital Signs ?BP (!) 148/82 (BP Location: Right Arm)   Pulse 96   Temp 98.1 ?F (36.7 ?C) (Oral)   Resp 18   LMP 08/23/2021 (Exact Date)   SpO2 100%  ? ?Visual Acuity ?Right Eye Distance:   ?Left Eye Distance:   ?Bilateral Distance:   ? ?Right Eye Near:   ?Left Eye Near:    ?Bilateral Near:    ? ?Physical Exam ?Vitals and nursing note reviewed.  ?Constitutional:   ?   Appearance: Normal appearance. She is not ill-appearing.  ?HENT:  ?   Head: Atraumatic.  ?   Mouth/Throat:  ?   Mouth: Mucous membranes are moist.  ?Eyes:  ?   Extraocular Movements: Extraocular movements intact.  ?   Conjunctiva/sclera: Conjunctivae normal.  ?Cardiovascular:  ?   Rate and Rhythm: Normal rate and regular rhythm.  ?   Heart sounds: Normal heart sounds.  ?Pulmonary:  ?   Effort: Pulmonary effort is normal.  ?   Breath sounds: Normal breath sounds.  ?Abdominal:  ?   General: Bowel sounds are normal. There is no distension.  ?   Palpations: Abdomen is soft.  ?   Tenderness: There is no abdominal tenderness. There is no guarding.  ?Musculoskeletal:     ?   General: Normal range of motion.  ?   Cervical back: Normal range of motion and neck supple.  ?Skin: ?   General: Skin is warm and dry.  ?Neurological:  ?   Mental Status: She is alert and oriented to person, place, and time.  ?Psychiatric:     ?   Mood and Affect: Mood normal.     ?   Thought Content: Thought content normal.     ?   Judgment: Judgment normal.  ? ?UC Treatments / Results  ?Labs ?(all labs ordered  are listed, but only abnormal results are displayed) ?Labs Reviewed  ?POCT URINALYSIS DIP (MANUAL ENTRY) - Abnormal; Notable for the following components:  ?    Result Value  ? Spec Grav, UA  >=1.030 (*)   ? Blood, UA trace-intact (*)   ? All other components within normal limits  ?CERVICOVAGINAL ANCILLARY ONLY  ? ? ?EKG ? ? ?Radiology ?No results found. ? ?Procedures ?Procedures (including critical care time) ? ?Medications Ordered in UC ?Medications - No data to display ? ?Initial Impression / Assessment and Plan / UC Course  ?I have reviewed the triage vital signs and the nursing notes. ? ?Pertinent labs & imaging results that were available during my care of the patient were reviewed by me and considered in my medical decision making (see chart for details). ? ?  ? ?Signs reassuring, urinalysis negative for urinary tract infection, vaginal swab pending for rule out of other types of infection.  We will send a course of Megace to help halt the bleeding while she gets in with OB/GYN.  Discussed that she would have a withdrawal bleed after stopping the medication and follow-up as soon as possible with OB/GYN.  Return for any worsening symptoms. ? ?Final Clinical Impressions(s) / UC Diagnoses  ? ?Final diagnoses:  ?Menorrhagia with irregular cycle  ? ?Discharge Instructions   ?None ?  ? ?ED Prescriptions   ? ? Medication Sig Dispense Auth. Provider  ? megestrol (MEGACE) 40 MG tablet Take 1 tablet (40 mg total) by mouth daily. 30 tablet Particia Nearing, [place]  ? ?  ? ?PDMP not reviewed this encounter. ?  ?Particia Nearing, PA-C ?09/22/21 1558 ? ?

## 2021-09-23 ENCOUNTER — Telehealth (HOSPITAL_COMMUNITY): Payer: Self-pay | Admitting: Emergency Medicine

## 2021-09-23 LAB — CERVICOVAGINAL ANCILLARY ONLY
Bacterial Vaginitis (gardnerella): POSITIVE — AB
Candida Glabrata: NEGATIVE
Candida Vaginitis: NEGATIVE
Chlamydia: NEGATIVE
Comment: NEGATIVE
Comment: NEGATIVE
Comment: NEGATIVE
Comment: NEGATIVE
Comment: NEGATIVE
Comment: NORMAL
Neisseria Gonorrhea: NEGATIVE
Trichomonas: NEGATIVE

## 2021-09-23 MED ORDER — METRONIDAZOLE 500 MG PO TABS: 500.0000 mg | ORAL_TABLET | Freq: Two times a day (BID) | ORAL | 0 refills | Status: DC

## 2021-09-29 ENCOUNTER — Ambulatory Visit: Payer: Self-pay | Admitting: Pediatrics

## 2021-10-23 ENCOUNTER — Other Ambulatory Visit: Payer: Self-pay | Admitting: Family Medicine

## 2022-03-18 ENCOUNTER — Ambulatory Visit
Admission: RE | Admit: 2022-03-18 | Discharge: 2022-03-18 | Disposition: A | Discharge status: Home / Self Care | Payer: Medicaid Other | Source: Ambulatory Visit | Attending: Family Medicine | Admitting: Family Medicine

## 2022-03-18 VITALS — BP 129/78 | HR 86 | Temp 98.7°F | Resp 18

## 2022-03-18 DIAGNOSIS — R059 Cough, unspecified: Secondary | ICD-10-CM | POA: Insufficient documentation

## 2022-03-18 DIAGNOSIS — Z1152 Encounter for screening for COVID-19: Secondary | ICD-10-CM | POA: Diagnosis not present

## 2022-03-18 DIAGNOSIS — J069 Acute upper respiratory infection, unspecified: Secondary | ICD-10-CM | POA: Diagnosis not present

## 2022-03-18 LAB — RESP PANEL BY RT-PCR (FLU A&B, COVID) ARPGX2
Influenza A by PCR: NEGATIVE
Influenza B by PCR: NEGATIVE
SARS Coronavirus 2 by RT PCR: NEGATIVE

## 2022-03-18 MED ORDER — PROMETHAZINE-DM 6.25-15 MG/5ML PO SYRP: 5.0000 mL | ORAL_SOLUTION | Freq: Four times a day (QID) | ORAL | 0 refills | Status: DC | PRN

## 2022-03-18 MED ORDER — FLUTICASONE PROPIONATE 50 MCG/ACT NA SUSP: 1.0000 | Freq: Two times a day (BID) | NASAL | 2 refills | Status: DC

## 2022-03-18 NOTE — ED Provider Notes (Signed)
[facility]    CSN: [id number] Arrival date & time: 03/18/22  1435      History   Chief Complaint Chief Complaint  Patient presents with   Nasal Congestion    Entered by patient    HPI Teresa Farley is a 36 y.o. female.   Patient presenting today with 4-day history of congestion, sinus pressure, sore throat, chest tightness, shortness of breath.  Denies fever, chills, body aches, chest pain, abdominal pain, nausea vomiting or diarrhea.  Taking Mucinex with minimal relief.  Multiple sick contacts recently.  No known history of pertinent chronic medical problems.    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   Patient states that she is receiving treatment from [facility]    H/O varicella    Irregular periods/menstrual cycles    Low iron     Patient Active Problem List   Diagnosis Date Noted   Delivery of twins, both live--Twin A vaginal delivery, Twin B cesarean 09/14/2015   Twin gestation, dichorionic diamniotic 09/13/2015   Twins 03/20/2015   Vaginal bleeding in pregnancy 03/20/2015   Condyloma 02/23/2011    Past Surgical History:  Procedure Laterality Date   CESAREAN SECTION N/A 09/13/2015   Procedure: CESAREAN SECTION;  Surgeon: Geryl Rankins, MD;  Location: [facility];  Service: Obstetrics;  Laterality: N/A;   DILATION AND CURETTAGE OF UTERUS  june 2012    OB History     Gravida  4   Para  2   Term  2   Preterm  0   AB  2   Living  3      SAB  1   IAB  1   Ectopic  0   Multiple  1   Live Births  3            Home Medications    Prior to Admission medications   Medication Sig Start Date End Date Taking? Authorizing Provider  fluticasone (FLONASE) 50 MCG/ACT nasal spray Place 1 spray into both nostrils 2 (two) times daily. 03/18/22  Yes Particia Nearing, PA-C  promethazine-dextromethorphan (PROMETHAZINE-DM) 6.25-15 MG/5ML syrup Take 5 mLs by  mouth 4 (four) times daily as needed. 03/18/22  Yes Particia Nearing, PA-C  cyclobenzaprine (FLEXERIL) 10 MG tablet Take 1 tablet (10 mg total) by mouth 2 (two) times daily as needed for muscle spasms. 07/17/20   Moshe Cipro, NP  HYDROcodone-acetaminophen (HYCET) 7.5-325 mg/15 ml solution Take 10 mLs by mouth every 6 (six) hours as needed for moderate pain. 03/09/17   Raeford Razor, MD  ibuprofen (ADVIL,MOTRIN) 200 MG tablet Take 600 mg by mouth every 6 (six) hours as needed for headache or mild pain.    [provider]  ibuprofen (ADVIL,MOTRIN) 600 MG tablet Take 1 tablet (600 mg total) by mouth every 6 (six) hours. 09/15/15   Standard, Venus, CNM  megestrol (MEGACE) 40 MG tablet Take 1 tablet (40 mg total) by mouth daily. 09/22/21   Particia Nearing, PA-C  metroNIDAZOLE (FLAGYL) 500 MG tablet Take 1 tablet (500 mg total) by mouth 2 (two) times daily. 09/23/21   Lamptey, Britta Mccreedy, MD  ondansetron (ZOFRAN) 4 MG tablet Take 1 tablet (4 mg total) by mouth every 6 (six) hours. 07/17/20   Moshe Cipro, NP  pantoprazole (PROTONIX) 40 MG tablet Take 1 tablet (40 mg total) by mouth daily. 08/26/21   Particia Nearing, PA-C  Prenatal Vit-Fe Fumarate-FA (PRENATAL MULTIVITAMIN) TABS tablet Take 1 tablet by mouth daily at 12 noon.     [provider]  sucralfate (CARAFATE) 1 g tablet Take 1 tablet (1 g total) by mouth 3 (three) times daily as needed. May dissolve 1 tablet into a glass of water and drink as needed 08/26/21   Volney American, PA-C    Family History Family History  Problem Relation Age of Onset   Breast cancer Maternal Grandmother    Cancer Maternal Grandmother        OVARIAN    Social History Social History   Tobacco Use   Smoking status: Every Day    Packs/day: 0.50    Years: 5.00    Total pack years: 2.50    Types: Cigarettes    Last attempt to quit: 02/13/2015    Years since quitting: 7.0   Smokeless tobacco: Never  Vaping Use    Vaping Use: Never used  Substance Use Topics   Alcohol use: Yes    Alcohol/week: 0.0 standard drinks of alcohol    Comment: occasionally   Drug use: No     Allergies   Patient has no known allergies.   Review of Systems Review of Systems Per HPI  Physical Exam Triage Vital Signs ED Triage Vitals  Enc Vitals Group     BP 03/18/22 1441 129/78     Pulse Rate 03/18/22 1441 86     Resp 03/18/22 1441 18     Temp 03/18/22 1441 98.7 F (37.1 C)     Temp Source 03/18/22 1441 Oral     SpO2 03/18/22 1441 96 %     Weight --      Height --      Head Circumference --      Peak Flow --      Pain Score 03/18/22 1443 5     Pain Loc --      Pain Edu? --      Excl. in [place]? --    No data found.  Updated Vital Signs BP 129/78 (BP Location: Right Arm)   Pulse 86   Temp 98.7 F (37.1 C) (Oral)   Resp 18   LMP 02/22/2022 (Approximate)   SpO2 96%   Visual Acuity Right Eye Distance:   Left Eye Distance:   Bilateral Distance:    Right Eye Near:   Left Eye Near:    Bilateral Near:     Physical Exam Vitals and nursing note reviewed.  Constitutional:      Appearance: Normal appearance.  HENT:     Head: Atraumatic.     Right Ear: Tympanic membrane and external ear normal.     Left Ear: Tympanic membrane and external ear normal.     Nose: Rhinorrhea present.     Mouth/Throat:     Mouth: Mucous membranes are moist.     Pharynx: Posterior oropharyngeal erythema present.  Eyes:     Extraocular Movements: Extraocular movements intact.     Conjunctiva/sclera: Conjunctivae normal.  Cardiovascular:     Rate and Rhythm: Normal rate and regular rhythm.     Heart sounds: Normal heart sounds.  Pulmonary:     Effort: Pulmonary effort is normal.     Breath sounds: Normal breath sounds. No wheezing or rales.  Musculoskeletal:        General: Normal range of motion.     Cervical back: Normal range of motion and neck supple.  Skin:    General: Skin  is warm and dry.  Neurological:      Mental Status: She is alert and oriented to person, place, and time.  Psychiatric:        Mood and Affect: Mood normal.        Thought Content: Thought content normal.      UC Treatments / Results  Labs (all labs ordered are listed, but only abnormal results are displayed) Labs Reviewed  RESP PANEL BY RT-PCR (FLU A&B, COVID) [id number]    EKG   Radiology No results found.  Procedures Procedures (including critical care time)  Medications Ordered in UC Medications - No data to display  Initial Impression / Assessment and Plan / UC Course  I have reviewed the triage vital signs and the nursing notes.  Pertinent labs & imaging results that were available during my care of the patient were reviewed by me and considered in my medical decision making (see chart for details).     Vital signs and exam overall reassuring and suggestive of a viral upper respiratory infection, respiratory panel pending, treat with Phenergan DM, Flonase, supportive over-the-counter medications and home care.  Return for any worsening symptoms.  Final Clinical Impressions(s) / UC Diagnoses   Final diagnoses:  Viral URI with cough   Discharge Instructions   None    ED Prescriptions     Medication Sig Dispense Auth. Provider   promethazine-dextromethorphan (PROMETHAZINE-DM) 6.25-15 MG/5ML syrup Take 5 mLs by mouth 4 (four) times daily as needed. 100 mL Particia Nearing, PA-C   fluticasone [facility]) 50 MCG/ACT nasal spray Place 1 spray into both nostrils 2 (two) times daily. 16 g Particia Nearing, [place]      PDMP not reviewed this encounter.   Particia Nearing, [place] 03/18/22 1506

## 2022-03-18 NOTE — ED Triage Notes (Signed)
Head congestion, sore throat, feels SOB since Tuesday.  Has been taking mucinex without relief.

## 2022-04-06 ENCOUNTER — Encounter: Payer: Self-pay | Admitting: Family Medicine

## 2022-04-06 ENCOUNTER — Ambulatory Visit: Payer: Medicaid Other | Admitting: Family Medicine

## 2022-04-06 VITALS — BP 129/83 | HR 94 | Ht 67.0 in | Wt 240.1 lb

## 2022-04-06 DIAGNOSIS — R7301 Impaired fasting glucose: Secondary | ICD-10-CM | POA: Diagnosis not present

## 2022-04-06 DIAGNOSIS — Z1159 Encounter for screening for other viral diseases: Secondary | ICD-10-CM | POA: Diagnosis not present

## 2022-04-06 DIAGNOSIS — E66812 Obesity, class 2: Secondary | ICD-10-CM | POA: Insufficient documentation

## 2022-04-06 DIAGNOSIS — E7849 Other hyperlipidemia: Secondary | ICD-10-CM

## 2022-04-06 DIAGNOSIS — E038 Other specified hypothyroidism: Secondary | ICD-10-CM

## 2022-04-06 DIAGNOSIS — M79672 Pain in left foot: Secondary | ICD-10-CM

## 2022-04-06 DIAGNOSIS — E559 Vitamin D deficiency, unspecified: Secondary | ICD-10-CM | POA: Diagnosis not present

## 2022-04-06 DIAGNOSIS — Z6837 Body mass index (BMI) 37.0-37.9, adult: Secondary | ICD-10-CM | POA: Diagnosis not present

## 2022-04-06 DIAGNOSIS — N920 Excessive and frequent menstruation with regular cycle: Secondary | ICD-10-CM | POA: Diagnosis not present

## 2022-04-06 DIAGNOSIS — E669 Obesity, unspecified: Secondary | ICD-10-CM | POA: Diagnosis not present

## 2022-04-06 MED ORDER — PHENTERMINE HCL 15 MG PO CAPS: 15.0000 mg | ORAL_CAPSULE | ORAL | 0 refills | Status: DC

## 2022-04-06 MED ORDER — MEGESTROL ACETATE 20 MG PO TABS: 20.0000 mg | ORAL_TABLET | Freq: Two times a day (BID) | ORAL | 0 refills | Status: AC

## 2022-04-06 NOTE — Progress Notes (Signed)
New Patient Office Visit  Subjective:  Patient ID: Teresa Farley, female    DOB: [birth year]  Age: 36 y.o. MRN: [id number]  CC:  Chief Complaint  Patient presents with   Establish Care    New patient, has not had a PCP in several years. Would like to discuss weight management also has feet pain, more so on the left foot ongoing for months, wants to discuss heavy periods, has been going on for 01/04/2022. Due for pap will come back to have it done here.     HPI Teresa Farley is a 36 y.o. female with past medical history of tubal ligation presents for establishing care.  Menorrhagia with regular cycles: She complains of heavy menstrual cycles for more than 5 months.  She reports changing her pads 8 times daily when on her cycle with large blood clots.  She denies abdominal pain, cramps, and prolonged menstrual cycles.  She notes that her heavy menstrual cycle has worsened in the last 2 to 3 months.  She was seen in the ED on 09/22/2021 for menorrhagia with an irregular menstrual cycle and was treated with Megace 40 mg daily.    Obesity: She reports increased weight gain, with the inability to lose weight independently with lifestyle modification.  She would like to discuss weight loss options.   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  She denies any recent injury or trauma to the affected foot.  She denies swelling, warmth, and redness of the affected foot.  She reports that her increased foot pain could be due to increased weight gain.     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   Patient states that she is receiving treatment from [facility]    H/O varicella    Irregular periods/menstrual cycles    Low iron     Past Surgical History:  Procedure Laterality Date   CESAREAN SECTION N/A 09/13/2015   Procedure: CESAREAN  SECTION;  Surgeon: Thurnell Lose, MD;  Location: [facility];  Service: Obstetrics;  Laterality: N/A;   DILATION AND CURETTAGE OF UTERUS  june 2012    Family History  Problem Relation Age of Onset   Breast cancer Maternal Grandmother    Cancer Maternal Grandmother        OVARIAN    Social History   Socioeconomic History   Marital status: Single    Spouse name: Not on file   Number of children: Not on file   Years of education: Not on file   Highest education level: Not on file  Occupational History   Not on file  Tobacco Use   Smoking status: Every Day    Packs/day: 0.50    Years: 5.00    Total pack years: 2.50    Types: Cigarettes    Last attempt to quit: 02/13/2015    Years since quitting: 7.1   Smokeless tobacco: Never  Vaping Use   Vaping Use: Never used  Substance and Sexual Activity   Alcohol use: Yes    Alcohol/week: 0.0 standard drinks of alcohol    Comment: occasionally   Drug use: No   Sexual activity: Yes    Birth control/protection: None    Comment: MIRENA  Other Topics Concern   Not on file  Social History Narrative   Not on file   Social  Determinants of Health   Financial Resource Strain: Not on file  Food Insecurity: Not on file  Transportation Needs: Not on file  Physical Activity: Not on file  Stress: Not on file  Social Connections: Not on file  Intimate Partner Violence: Not on file    ROS Review of Systems  Constitutional:  Negative for fatigue and fever.  HENT:  Negative for sinus pressure and sinus pain.   Eyes:  Negative for photophobia and visual disturbance.  Respiratory:  Negative for chest tightness and shortness of breath.   Cardiovascular:  Negative for chest pain and palpitations.  Gastrointestinal:  Negative for nausea and vomiting.  Endocrine: Negative for polyphagia and polyuria.  Genitourinary:  Positive for menstrual problem. Negative for hematuria, urgency, vaginal discharge and vaginal pain.  Musculoskeletal:  Negative  for neck pain and neck stiffness.       Left foot pain  Skin:  Negative for rash and wound.  Allergic/Immunologic: Negative for immunocompromised state.  Neurological:  Negative for dizziness and headaches.  Psychiatric/Behavioral:  Negative for self-injury and suicidal ideas.     Objective:   Today's Vitals: BP 129/83   Pulse 94   Ht _0  (1.702 m)   Wt 240 lb 1.9 oz (108.9 kg)   LMP 02/22/2022 (Approximate)   SpO2 97%   BMI 37.61 kg/m   Physical Exam Constitutional:      Appearance: She is obese.  HENT:     Head: Normocephalic.     Right Ear: External ear normal.     Left Ear: External ear normal.     Nose: No congestion.     Mouth/Throat:     Mouth: Mucous membranes are moist.  Eyes:     Extraocular Movements: Extraocular movements intact.     Pupils: Pupils are equal, round, and reactive to light.  Cardiovascular:     Rate and Rhythm: Normal rate and regular rhythm.     Pulses: Normal pulses.     Heart sounds: Normal heart sounds.  Pulmonary:     Effort: Pulmonary effort is normal.     Breath sounds: Normal breath sounds.  Abdominal:     Tenderness: There is no right CVA tenderness or left CVA tenderness.  Musculoskeletal:     Cervical back: No tenderness.     Right lower leg: No edema.     Left lower leg: No edema.     Comments: No pain with palpation of the left foot No swelling, redness, warmth noted Range of motion of the left foot is intact  Skin:    Coloration: Skin is not pale.     Findings: No bruising.  Neurological:     Mental Status: She is oriented to person, place, and time.  Psychiatric:     Comments: Normal affect     Assessment & Plan:   Problem List Items Addressed This Visit       Other   Menorrhagia with regular cycle - Primary    Referral placed to GYN for further evaluation Megace 20 mg twice daily order        Relevant Medications   megestrol (MEGACE) 20 MG tablet   Other Relevant Orders   Ambulatory referral to  Obstetrics / Gynecology   Obesity, Class II, BMI 35-39.9    We will start patient on oral phentermine 50 mg daily She denies history of cardiovascular disease, hypothyroidism, and glaucoma We will follow-up in 1 month to uptitrate the dose as needed Encouraged to continue lifestyle  modification with healthy eating habits and increase physical activities       Left foot pain    Encouraged symptomatic management, including wearing shoes with good support while at work Taking Tylenol as needed for pain, soaking feet and Epsom salt as needed       Other Visit Diagnoses     IFG (impaired fasting glucose)       Relevant Orders   Hemoglobin A1c   Vitamin D deficiency       Relevant Orders   VITAMIN D 25 Hydroxy (Vit-D Deficiency, Fractures)   Need for hepatitis C screening test       Relevant Orders   Hepatitis C antibody   Other specified hypothyroidism       Relevant Orders   TSH + free T4   Other hyperlipidemia       Relevant Orders   Lipid panel   CMP14+EGFR   CBC with Differential/Platelet   Class 2 severe obesity with serious comorbidity and body mass index (BMI) of 37.0 to 37.9 in adult, unspecified obesity type (HCC)       Relevant Medications   phentermine 15 MG capsule       Outpatient Encounter Medications as of 04/06/2022  Medication Sig   megestrol (MEGACE) 20 MG tablet Take 1 tablet (20 mg total) by mouth 2 (two) times daily for 10 days.   phentermine 15 MG capsule Take 1 capsule (15 mg total) by mouth every morning.   ibuprofen (ADVIL,MOTRIN) 200 MG tablet Take 600 mg by mouth every 6 (six) hours as needed for headache or mild pain. (Patient not taking: Reported on 04/06/2022)   ibuprofen (ADVIL,MOTRIN) 600 MG tablet Take 1 tablet (600 mg total) by mouth every 6 (six) hours. (Patient not taking: Reported on 04/06/2022)   [DISCONTINUED] cyclobenzaprine (FLEXERIL) 10 MG tablet Take 1 tablet (10 mg total) by mouth 2 (two) times daily as needed for muscle spasms.  (Patient not taking: Reported on 04/06/2022)   [DISCONTINUED] fluticasone (FLONASE) 50 MCG/ACT nasal spray Place 1 spray into both nostrils 2 (two) times daily. (Patient not taking: Reported on 04/06/2022)   [DISCONTINUED] HYDROcodone-acetaminophen (HYCET) 7.5-325 mg/15 ml solution Take 10 mLs by mouth every 6 (six) hours as needed for moderate pain. (Patient not taking: Reported on 04/06/2022)   [DISCONTINUED] megestrol (MEGACE) 40 MG tablet Take 1 tablet (40 mg total) by mouth daily. (Patient not taking: Reported on 04/06/2022)   [DISCONTINUED] metroNIDAZOLE (FLAGYL) 500 MG tablet Take 1 tablet (500 mg total) by mouth 2 (two) times daily. (Patient not taking: Reported on 04/06/2022)   [DISCONTINUED] ondansetron (ZOFRAN) 4 MG tablet Take 1 tablet (4 mg total) by mouth every 6 (six) hours. (Patient not taking: Reported on 04/06/2022)   [DISCONTINUED] pantoprazole (PROTONIX) 40 MG tablet Take 1 tablet (40 mg total) by mouth daily. (Patient not taking: Reported on 04/06/2022)   [DISCONTINUED] Prenatal Vit-Fe Fumarate-FA (PRENATAL MULTIVITAMIN) TABS tablet Take 1 tablet by mouth daily at 12 noon.  (Patient not taking: Reported on 04/06/2022)   [DISCONTINUED] promethazine-dextromethorphan (PROMETHAZINE-DM) 6.25-15 MG/5ML syrup Take 5 mLs by mouth 4 (four) times daily as needed. (Patient not taking: Reported on 04/06/2022)   [DISCONTINUED] sucralfate (CARAFATE) 1 g tablet Take 1 tablet (1 g total) by mouth 3 (three) times daily as needed. May dissolve 1 tablet into a glass of water and drink as needed (Patient not taking: Reported on 04/06/2022)   No facility-administered encounter medications on file as of 04/06/2022.    Follow-up: No follow-ups on  file.   Alvira Monday, FNP

## 2022-04-06 NOTE — Assessment & Plan Note (Signed)
We will start patient on oral phentermine 50 mg daily She denies history of cardiovascular disease, hypothyroidism, and glaucoma We will follow-up in 1 month to uptitrate the dose as needed Encouraged to continue lifestyle modification with healthy eating habits and increase physical activities

## 2022-04-06 NOTE — Assessment & Plan Note (Signed)
Encouraged symptomatic management, including wearing shoes with good support while at work Taking Tylenol as needed for pain, soaking feet and Epsom salt as needed

## 2022-04-06 NOTE — Assessment & Plan Note (Signed)
Referral placed to GYN for further evaluation Megace 20 mg twice daily order

## 2022-04-06 NOTE — Patient Instructions (Signed)
I appreciate the opportunity to provide care to you today!    Follow up:  1 week for pap smear  Labs: please stop by the lab during the week to get your blood drawn (CBC, CMP, TSH, Lipid profile, HgA1c, Vit D)  Screening: Hep C   Please pick up your medication at the pharmacy  Referral: GYN    Please continue to a heart-healthy diet and increase your physical activities. Try to exercise for at least three times a week.      It was a pleasure to see you and I look forward to continuing to work together on your health and well-being. Please do not hesitate to call the office if you need care or have questions about your care.   Have a wonderful day and week. With Gratitude, Gilmore Laroche MSN, FNP-BC

## 2022-04-20 ENCOUNTER — Ambulatory Visit: Payer: Medicaid Other | Admitting: Family Medicine

## 2022-04-20 ENCOUNTER — Encounter: Payer: Self-pay | Admitting: Family Medicine

## 2022-05-04 ENCOUNTER — Encounter: Payer: Medicaid Other | Admitting: Obstetrics & Gynecology

## 2022-05-10 ENCOUNTER — Other Ambulatory Visit: Payer: Self-pay | Admitting: Family Medicine

## 2022-05-10 DIAGNOSIS — N920 Excessive and frequent menstruation with regular cycle: Secondary | ICD-10-CM

## 2022-05-12 NOTE — Telephone Encounter (Signed)
No refills on megestrol 20 mg.  Please advise pt to schedule a visit for uptriate phentermine

## 2022-05-26 ENCOUNTER — Other Ambulatory Visit: Payer: Self-pay | Admitting: Family Medicine

## 2022-05-26 NOTE — Telephone Encounter (Signed)
yes

## 2022-06-01 ENCOUNTER — Ambulatory Visit: Payer: Medicaid Other | Admitting: Family Medicine

## 2022-06-01 ENCOUNTER — Encounter: Payer: Self-pay | Admitting: Family Medicine

## 2022-06-01 VITALS — BP 116/77 | HR 79 | Ht 67.0 in | Wt 229.0 lb

## 2022-06-01 DIAGNOSIS — T50905A Adverse effect of unspecified drugs, medicaments and biological substances, initial encounter: Secondary | ICD-10-CM

## 2022-06-01 DIAGNOSIS — R634 Abnormal weight loss: Secondary | ICD-10-CM | POA: Diagnosis not present

## 2022-06-01 DIAGNOSIS — E669 Obesity, unspecified: Secondary | ICD-10-CM

## 2022-06-01 MED ORDER — PHENTERMINE HCL 30 MG PO CAPS: 30.0000 mg | ORAL_CAPSULE | ORAL | 0 refills | Status: DC

## 2022-06-01 NOTE — Progress Notes (Signed)
Established Patient Office Visit  Subjective   Patient ID: Teresa Farley, female    DOB: 1985-09-04  Age: 37 y.o. MRN: [id number]  Chief Complaint  Patient presents with   Weight Loss    Patient wants a refill of phentermine. Had no side effects from it, felt like she was losing weight while on it. Has been out of it for several weeks.     Teresa Farley 37 year old female, present to the clinic for follow up for weight loss management. Patient was started on phentermine in November 2023 and was able to lose 10 pounds.     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 Negative for chills, diaphoresis, fever and malaise/fatigue.  HENT:  Negative for ear pain.   Respiratory: Negative.    Cardiovascular:  Negative for chest pain.  Gastrointestinal: Negative.   Genitourinary: Negative.   Musculoskeletal: Negative.   Neurological:  Negative for dizziness and headaches.      Objective:     BP 116/77   Pulse 79   Ht 5\' 7"  (1.702 m)   Wt 229 lb (103.9 kg)   SpO2 98%   BMI 35.87 kg/m  BP Readings from Last 3 Encounters:  06/01/22 116/77  04/06/22 129/83  03/18/22 129/78      Physical Exam Vitals reviewed.  Constitutional:      Appearance: Normal appearance.  Cardiovascular:     Rate and Rhythm: Normal rate and regular rhythm.     Pulses: Normal pulses.     Heart sounds: Normal heart sounds.  Pulmonary:     Effort: Pulmonary effort is normal.     Breath sounds: Normal breath sounds.  Skin:    General: Skin is warm and dry.     Capillary  Refill: Capillary refill takes less than 2 seconds.  Neurological:     Mental Status: She is alert.  Psychiatric:        Mood and Affect: Mood normal.      No results found for any visits on 06/01/22.  Last CBC Lab Results  Component Value Date   WBC 15.2 (H) 03/09/2017   HGB 15.3 (H) 03/09/2017   HCT 46.2 (H) 03/09/2017   MCV 89.5 03/09/2017   MCH 29.7 03/09/2017   RDW 12.9 03/09/2017   PLT 204 03/09/2017      The ASCVD Risk score (Arnett DK, et al., 2019) failed to calculate for the following reasons:   The 2019 ASCVD risk score is only valid for ages 83 to 30    Assessment & Plan:   Problem List Items Addressed This Visit       Other   Obesity, Class II, BMI 35-39.9 (Chronic)     Increase dosage of phentermine to 30 mg for 30 days. Instructed patient to follow up in 30 days.  Patient stated for breakfast she usually eats a fiber bar, boiled eggs, apples, and for lunch grilled chicken, greens beans, carbs includes baked potatoes. Her fluid intake consist of 1000 ml of water a day, denies drinking soda and  juice Her goal weight is 180 lbs. Explain patient the importance of exercise for weight loss  Tubal ligation done 2017       Other Visit Diagnoses     Weight loss    -  Primary   Relevant Medications   phentermine 30 MG capsule   Adverse effect of drug, initial encounter           No follow-ups on file.    Renard Hamper Ria Comment, FNP

## 2022-06-01 NOTE — Assessment & Plan Note (Addendum)
  Increase dosage of phentermine to 30 mg for 30 days. Instructed patient to follow up in 30 days.  Patient stated for breakfast she usually eats a fiber bar, boiled eggs, apples, and for lunch grilled chicken, greens beans, carbs includes baked potatoes. Her fluid intake consist of 1000 ml of water a day, denies drinking soda and juice Her goal weight is 180 lbs. Explain patient the importance of exercise for weight loss  Tubal ligation done 2017

## 2022-06-01 NOTE — Patient Instructions (Signed)
Follow up in one month for weight loss management.

## 2022-06-02 ENCOUNTER — Other Ambulatory Visit: Payer: Self-pay | Admitting: Family Medicine

## 2022-06-02 DIAGNOSIS — R634 Abnormal weight loss: Secondary | ICD-10-CM

## 2022-06-03 ENCOUNTER — Telehealth: Payer: Self-pay | Admitting: Family Medicine

## 2022-06-03 ENCOUNTER — Other Ambulatory Visit: Payer: Self-pay

## 2022-06-03 DIAGNOSIS — R634 Abnormal weight loss: Secondary | ICD-10-CM

## 2022-06-03 MED ORDER — PHENTERMINE HCL 30 MG PO CAPS: 30.0000 mg | ORAL_CAPSULE | ORAL | 0 refills | Status: AC

## 2022-06-03 NOTE — Telephone Encounter (Signed)
Patient called said her pharmacy is out of stock with medicine phentermine 30 MG capsule [[id number]]   What should patient do? Please call patient back.  Pharmacy  [id number] - [place], [place] - [address], [place] [address] Phone: [phone]  Fax: [phone] DEA #: [id number]

## 2022-06-03 NOTE — Telephone Encounter (Signed)
Spoke to pt she asked for rx to be sent to [facility] [address], Linna [place], rx faxed .

## 2022-06-08 ENCOUNTER — Other Ambulatory Visit (HOSPITAL_COMMUNITY)
Admission: RE | Admit: 2022-06-08 | Discharge: 2022-06-08 | Disposition: A | Discharge status: Home / Self Care | Payer: Medicaid Other | Source: Ambulatory Visit | Attending: Obstetrics & Gynecology | Admitting: Obstetrics & Gynecology

## 2022-06-08 ENCOUNTER — Ambulatory Visit: Payer: Medicaid Other | Admitting: Obstetrics & Gynecology

## 2022-06-08 ENCOUNTER — Encounter: Payer: Self-pay | Admitting: Obstetrics & Gynecology

## 2022-06-08 VITALS — BP 124/73 | HR 81 | Ht 67.0 in | Wt 227.0 lb

## 2022-06-08 DIAGNOSIS — E669 Obesity, unspecified: Secondary | ICD-10-CM

## 2022-06-08 DIAGNOSIS — Z124 Encounter for screening for malignant neoplasm of cervix: Secondary | ICD-10-CM

## 2022-06-08 DIAGNOSIS — Z98891 History of uterine scar from previous surgery: Secondary | ICD-10-CM | POA: Diagnosis not present

## 2022-06-08 DIAGNOSIS — N939 Abnormal uterine and vaginal bleeding, unspecified: Secondary | ICD-10-CM | POA: Diagnosis not present

## 2022-06-08 DIAGNOSIS — Z720 Tobacco use: Secondary | ICD-10-CM

## 2022-06-08 DIAGNOSIS — E66812 Obesity, class 2: Secondary | ICD-10-CM

## 2022-06-08 NOTE — Progress Notes (Signed)
GYN VISIT Patient name: Teresa Farley MRN [id number]  Date of birth: 11-Sep-1985 Chief Complaint:   Menorrhagia  History of Present Illness:   Teresa Farley is a 37 y.o. [phone] female being seen today for the following concerns:  AUB: Today she notes that she has had this issue since summer time.  Menses are anywhere between 3-4 weeks.  Will maybe go off for a week or so before the bleeding will return.  Heaviness will last about 1-2 weeks- changing period underwear every hour.  Notes numerous accidents especially at night.  She also notes plum-sized clots. Denies dysmenorrhea.  -Pt was prescribed Megace twice daily, which has helped to slow her bleeding.     Prior C-section x 1 with tubal ligation  No LMP recorded.     06/08/2022    3:27 PM 06/01/2022    3:45 PM 04/06/2022    3:04 PM  Depression screen PHQ 2/9  Decreased Interest 1 2 3   Down, Depressed, Hopeless 1 1 2   PHQ - 2 Score 2 3 5   Altered sleeping 3 2 2   Tired, decreased energy 3 3 3   Change in appetite 1 3 3   Feeling bad or failure about yourself  1 1 2   Trouble concentrating 1 1 2   Moving slowly or fidgety/restless 1 1 1   Suicidal thoughts 0 0 0  PHQ-9 Score 12 14 18   Difficult doing work/chores  Very difficult Very difficult     Review of Systems:   Pertinent items are noted in HPI Denies fever/chills, dizziness, headaches, visual disturbances, fatigue, shortness of breath, chest pain, abdominal pain, vomiting.  Denies urinary or GI concerns.  Denies pain with intercourse Pertinent History Reviewed:  Reviewed past medical,surgical, social, obstetrical and family history.  Reviewed problem list, medications and allergies. Physical Assessment:   Vitals:   06/08/22 1529  BP: 124/73  Pulse: 81  Weight: 227 lb (103 kg)  Height: 5\' 7"  (1.702 m)  Body mass index is 35.55 kg/m.       Physical Examination:   General appearance: alert, well appearing, and in no distress  Psych: mood appropriate, normal  affect  Skin: warm & dry   Cardiovascular: normal heart rate noted  Respiratory: normal respiratory effort, no distress  Abdomen: soft, non-tender, no rebound, no guarding Prior C-section incision well-healed  Pelvic: VULVA: normal appearing vulva with no masses, tenderness or lesions, VAGINA: normal appearing vagina with normal color and discharge, no lesions, minimal bleeding noted.  Long speculum used to reach cervix CERVIX: normal appearing cervix without discharge or lesions, UTERUS: uterus is normal size, shape, consistency and nontender, ADNEXA: normal adnexa in size, nontender and no masses  Extremities: no edema   Chaperone: Celene Squibb    Assessment & Plan:  1) HMB -reviewed work up including CBC/iron panel to r/o anemia and pelvic US -discussed management options from medications including continued Megace to IUD to surgical intervention -previously had IUD and noted considerable pelvic pain -pt given list of options and plans to review on her own []  should she desire hysterectomy likely TAH due to prior surgery and minimal uterine descent  2) cervical cancer screening -pap collected, reviewed [facility]P guidelines   Orders Placed This Encounter  Procedures   US PELVIC COMPLETE WITH TRANSVAGINAL   CBC   Iron, TIBC and Ferritin Panel    Return for please schedule pelvic US and send to lab, once completed ok for televisit appt to review results.   Janyth Pupa, DO Attending  [place], Product/process development scientist for [employer], [place]

## 2022-06-09 LAB — CBC
Hematocrit: 42.2 % (ref 34.0–46.6)
Hemoglobin: 14 g/dL (ref 11.1–15.9)
MCH: 28.9 pg (ref 26.6–33.0)
MCHC: 33.2 g/dL (ref 31.5–35.7)
MCV: 87 fL (ref 79–97)
Platelets: 232 10*3/uL (ref 150–450)
RBC: 4.85 x10E6/uL (ref 3.77–5.28)
RDW: 12 % (ref 11.7–15.4)
WBC: 9.5 10*3/uL (ref 3.4–10.8)

## 2022-06-09 LAB — IRON,TIBC AND FERRITIN PANEL
Ferritin: 23 ng/mL (ref 15–150)
Iron Saturation: 16 % (ref 15–55)
Iron: 75 ug/dL (ref 27–159)
Total Iron Binding Capacity: 461 ug/dL — ABNORMAL HIGH (ref 250–450)
UIBC: 386 ug/dL (ref 131–425)

## 2022-06-15 ENCOUNTER — Ambulatory Visit (HOSPITAL_COMMUNITY)
Admission: RE | Admit: 2022-06-15 | Discharge: 2022-06-15 | Disposition: A | Discharge status: Home / Self Care | Payer: Medicaid Other | Source: Ambulatory Visit | Attending: Obstetrics & Gynecology | Admitting: Obstetrics & Gynecology

## 2022-06-15 DIAGNOSIS — N939 Abnormal uterine and vaginal bleeding, unspecified: Secondary | ICD-10-CM | POA: Diagnosis not present

## 2022-06-15 DIAGNOSIS — N888 Other specified noninflammatory disorders of cervix uteri: Secondary | ICD-10-CM | POA: Diagnosis not present

## 2022-06-15 DIAGNOSIS — Z9851 Tubal ligation status: Secondary | ICD-10-CM | POA: Diagnosis not present

## 2022-06-17 LAB — CYTOLOGY - PAP
Chlamydia: NEGATIVE
Comment: NEGATIVE
Comment: NEGATIVE
Comment: NEGATIVE
Comment: NEGATIVE
Comment: NORMAL
Diagnosis: UNDETERMINED — AB
HPV 16: NEGATIVE
HPV 18 / 45: NEGATIVE
High risk HPV: POSITIVE — AB
Neisseria Gonorrhea: NEGATIVE

## 2022-06-22 ENCOUNTER — Encounter: Payer: Medicaid Other | Admitting: Obstetrics & Gynecology

## 2022-06-22 ENCOUNTER — Telehealth: Payer: Medicaid Other | Admitting: Obstetrics & Gynecology

## 2022-07-06 ENCOUNTER — Ambulatory Visit: Payer: Medicaid Other | Admitting: Family Medicine

## 2022-07-13 ENCOUNTER — Encounter: Payer: Medicaid Other | Admitting: Obstetrics & Gynecology

## 2022-07-14 ENCOUNTER — Other Ambulatory Visit: Payer: Self-pay | Admitting: Family Medicine

## 2022-07-14 DIAGNOSIS — N920 Excessive and frequent menstruation with regular cycle: Secondary | ICD-10-CM

## 2022-07-25 ENCOUNTER — Encounter: Payer: Medicaid Other | Admitting: Obstetrics & Gynecology

## 2022-11-16 ENCOUNTER — Ambulatory Visit: Payer: Self-pay

## 2022-11-18 DIAGNOSIS — E669 Obesity, unspecified: Secondary | ICD-10-CM | POA: Diagnosis not present

## 2022-11-18 DIAGNOSIS — G501 Atypical facial pain: Secondary | ICD-10-CM | POA: Diagnosis not present

## 2022-11-18 DIAGNOSIS — R030 Elevated blood-pressure reading, without diagnosis of hypertension: Secondary | ICD-10-CM | POA: Diagnosis not present

## 2022-11-18 DIAGNOSIS — K0889 Other specified disorders of teeth and supporting structures: Secondary | ICD-10-CM | POA: Diagnosis not present

## 2022-11-18 DIAGNOSIS — Z6838 Body mass index (BMI) 38.0-38.9, adult: Secondary | ICD-10-CM | POA: Diagnosis not present

## 2023-07-19 DIAGNOSIS — Z6836 Body mass index (BMI) 36.0-36.9, adult: Secondary | ICD-10-CM | POA: Diagnosis not present

## 2023-07-19 DIAGNOSIS — M25521 Pain in right elbow: Secondary | ICD-10-CM | POA: Diagnosis not present

## 2023-07-19 DIAGNOSIS — E669 Obesity, unspecified: Secondary | ICD-10-CM | POA: Diagnosis not present

## 2024-02-16 ENCOUNTER — Encounter: Payer: Self-pay | Admitting: *Deleted

## 2024-04-17 DIAGNOSIS — J019 Acute sinusitis, unspecified: Secondary | ICD-10-CM | POA: Diagnosis not present

## 2024-04-17 DIAGNOSIS — R059 Cough, unspecified: Secondary | ICD-10-CM | POA: Diagnosis not present

## 2024-04-17 DIAGNOSIS — R030 Elevated blood-pressure reading, without diagnosis of hypertension: Secondary | ICD-10-CM | POA: Diagnosis not present

## 2024-04-17 DIAGNOSIS — J069 Acute upper respiratory infection, unspecified: Secondary | ICD-10-CM | POA: Diagnosis not present

## 2024-05-12 DIAGNOSIS — J069 Acute upper respiratory infection, unspecified: Secondary | ICD-10-CM | POA: Diagnosis not present
